# Patient Record
Sex: Female | Born: 1990 | Race: Black or African American | Hispanic: No | Marital: Single | Smoking: Never smoker
Health system: Southern US, Community
[De-identification: ages and names within clinical notes are randomized; demographics above are authoritative.]

## PROBLEM LIST (undated history)

## (undated) DIAGNOSIS — Z9889 Other specified postprocedural states: Secondary | ICD-10-CM

## (undated) DIAGNOSIS — J302 Other seasonal allergic rhinitis: Secondary | ICD-10-CM

## (undated) DIAGNOSIS — K429 Umbilical hernia without obstruction or gangrene: Secondary | ICD-10-CM

## (undated) DIAGNOSIS — J45909 Unspecified asthma, uncomplicated: Secondary | ICD-10-CM

## (undated) DIAGNOSIS — R112 Nausea with vomiting, unspecified: Secondary | ICD-10-CM

## (undated) HISTORY — PX: ANTERIOR CRUCIATE LIGAMENT REPAIR: SHX115

---
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
## 2013-07-04 ENCOUNTER — Emergency Department (INDEPENDENT_AMBULATORY_CARE_PROVIDER_SITE_OTHER)
Admission: EM | Admit: 2013-07-04 | Discharge: 2013-07-04 | Disposition: A | Discharge status: Home / Self Care | Payer: BC Managed Care – PPO | Source: Home / Self Care | Attending: Emergency Medicine | Admitting: Emergency Medicine

## 2013-07-04 ENCOUNTER — Encounter (HOSPITAL_COMMUNITY): Payer: Self-pay | Admitting: Emergency Medicine

## 2013-07-04 DIAGNOSIS — K2970 Gastritis, unspecified, without bleeding: Secondary | ICD-10-CM

## 2013-07-04 MED ORDER — ONDANSETRON 4 MG PO TBDP
ORAL_TABLET | ORAL | Status: AC
  Filled 2013-07-04: qty 1

## 2013-07-04 MED ORDER — ONDANSETRON HCL 4 MG PO TABS: 4.0000 mg | ORAL_TABLET | Freq: Four times a day (QID) | ORAL | Status: DC

## 2013-07-04 MED ORDER — ONDANSETRON 4 MG PO TBDP
4.0000 mg | ORAL_TABLET | Freq: Once | ORAL | Status: AC
  Administered 2013-07-04: 4 mg via ORAL

## 2013-07-04 NOTE — ED Provider Notes (Signed)
Medical screening examination/treatment/procedure(s) were performed by non-physician practitioner and as supervising physician I was immediately available for consultation/collaboration.  Ednamae Schiano, M.D.  Emilliano Dilworth C Celese Banner, MD 07/04/13 1438 

## 2013-07-04 NOTE — ED Provider Notes (Signed)
CSN: [id number]     Arrival date & time 07/04/13  4098 History   None    Chief Complaint  Patient presents with  . Emesis    onset 6:00 a.m today.    (Consider location/radiation/quality/duration/timing/severity/associated sxs/prior Treatment) HPI Comments: Pt drank heavily last night. Doesn't remember how much she drank. Woke up this morning, tried to drink water and vomited.  Unable to keep liquids down.   Patient is a 22 y.o. female presenting with vomiting. The history is provided by the patient.  Emesis Severity:  Moderate Duration:  5 hours Timing:  Intermittent Number of daily episodes:  5 Quality:  Stomach contents Progression:  Unchanged Chronicity:  New Recent urination:  Decreased Relieved by:  None tried Worsened by:  Liquids Ineffective treatments:  Liquids Associated symptoms: diarrhea   Associated symptoms: no abdominal pain, no chills and no fever   Diarrhea:    Quality:  Semi-solid   Number of occurrences:  2   Severity:  Mild   Duration:  4 hours   Timing:  Intermittent   Progression:  Unchanged Risk factors: alcohol use   Risk factors: no sick contacts     History reviewed. No pertinent past medical history. History reviewed. No pertinent past surgical history. History reviewed. No pertinent family history. History  Substance Use Topics  . Smoking status: Never Smoker   . Smokeless tobacco: Not on file  . Alcohol Use: Yes   OB History   Grav Para Term Preterm Abortions TAB SAB Ect Mult Living                 Review of Systems  Constitutional: Negative for fever and chills.  Gastrointestinal: Positive for nausea, vomiting and diarrhea. Negative for abdominal pain.    Allergies  Review of patient's allergies indicates no known allergies.  Home Medications   Current Outpatient Rx  Name  Route  Sig  Dispense  Refill  . ondansetron (ZOFRAN) 4 MG tablet   Oral   Take 1 tablet (4 mg total) by mouth every 6 (six) hours.   12 tablet   0     BP 126/85  Pulse 73  Temp(Src) 97.8 F (36.6 C)  Resp 20  SpO2 99%  LMP 06/25/2013 Physical Exam  Constitutional: She appears well-developed and well-nourished.  Appears ill  Cardiovascular: Normal rate and regular rhythm.   Pulmonary/Chest: Effort normal and breath sounds normal.  Abdominal: Soft. She exhibits no distension. Bowel sounds are decreased. There is generalized tenderness. There is no rigidity, no rebound and no guarding.  abd tenderness to palp is mild    ED Course  Procedures (including critical care time) Labs Review Labs Reviewed - No data to display Imaging Review No results found.  EKG Interpretation     Ventricular Rate:    PR Interval:    QRS Duration:   QT Interval:    QTC Calculation:   R Axis:     Text Interpretation:              MDM   1. Gastritis   Pt feels much better after zofran odt 4mg  and drinking a small can of ginger ale. Rx zofran 4mg  q6 hours prn nausea #12. Clear liquids today, can gradually progress to bland solids when stomach is more settled. Discussed dangers of drinking heavily.      Cathlyn Parsons, NP 07/04/13 1104

## 2013-07-04 NOTE — ED Notes (Signed)
Reports vomiting and diarrhea onset 6:00 a.m today. Mother states that pt is vomit is yellow and foamy. Unable to keep anything down.  Denies changes in diet. States had a lot to drink the night before. Denies fever.

## 2013-07-04 NOTE — ED Notes (Signed)
Fluid challenge at 10:21 a.m mw,cma

## 2014-10-11 ENCOUNTER — Other Ambulatory Visit: Payer: Self-pay | Admitting: Family Medicine

## 2014-10-11 DIAGNOSIS — R519 Headache, unspecified: Secondary | ICD-10-CM

## 2014-10-11 DIAGNOSIS — R51 Headache: Principal | ICD-10-CM

## 2016-03-13 ENCOUNTER — Other Ambulatory Visit: Payer: Self-pay | Admitting: Family Medicine

## 2016-03-13 DIAGNOSIS — K429 Umbilical hernia without obstruction or gangrene: Secondary | ICD-10-CM

## 2016-10-17 ENCOUNTER — Other Ambulatory Visit: Payer: Self-pay | Admitting: Surgery

## 2016-11-22 ENCOUNTER — Emergency Department
Admission: EM | Admit: 2016-11-22 | Discharge: 2016-11-22 | Disposition: A | Discharge status: Home / Self Care | Payer: BLUE CROSS/BLUE SHIELD | Attending: Emergency Medicine | Admitting: Emergency Medicine

## 2016-11-22 ENCOUNTER — Encounter: Payer: Self-pay | Admitting: Emergency Medicine

## 2016-11-22 DIAGNOSIS — K625 Hemorrhage of anus and rectum: Secondary | ICD-10-CM

## 2016-11-22 DIAGNOSIS — K649 Unspecified hemorrhoids: Secondary | ICD-10-CM | POA: Insufficient documentation

## 2016-11-22 LAB — COMPREHENSIVE METABOLIC PANEL
ALBUMIN: 4 g/dL (ref 3.5–5.0)
ALT: 19 U/L (ref 14–54)
AST: 27 U/L (ref 15–41)
Alkaline Phosphatase: 54 U/L (ref 38–126)
Anion gap: 6 (ref 5–15)
BILIRUBIN TOTAL: 0.3 mg/dL (ref 0.3–1.2)
BUN: 11 mg/dL (ref 6–20)
CO2: 25 mmol/L (ref 22–32)
Calcium: 8.7 mg/dL — ABNORMAL LOW (ref 8.9–10.3)
Chloride: 106 mmol/L (ref 101–111)
Creatinine, Ser: 0.75 mg/dL (ref 0.44–1.00)
GFR calc Af Amer: >60 mL/min (ref >60)
GFR calc non Af Amer: >60 mL/min (ref >60)
GLUCOSE: 89 mg/dL (ref 65–99)
POTASSIUM: 3.5 mmol/L (ref 3.5–5.1)
Sodium: 137 mmol/L (ref 135–145)
TOTAL PROTEIN: 7.6 g/dL (ref 6.5–8.1)

## 2016-11-22 LAB — CBC
HEMATOCRIT: 38.1 % (ref 35.0–47.0)
Hemoglobin: 12.8 g/dL (ref 12.0–16.0)
MCH: 29.6 pg (ref 26.0–34.0)
MCHC: 33.8 g/dL (ref 32.0–36.0)
MCV: 87.6 fL (ref 80.0–100.0)
Platelets: 197 10*3/uL (ref 150–440)
RBC: 4.34 MIL/uL (ref 3.80–5.20)
RDW: 13.8 % (ref 11.5–14.5)
WBC: 5.1 10*3/uL (ref 3.6–11.0)

## 2016-11-22 LAB — POCT PREGNANCY, URINE: Preg Test, Ur: NEGATIVE

## 2016-11-22 MED ORDER — HYDROCORTISONE 2.5 % RE CREA: 1.0000 "application " | TOPICAL_CREAM | Freq: Two times a day (BID) | RECTAL | 0 refills | Status: DC

## 2016-11-22 MED ORDER — DOCUSATE SODIUM 100 MG PO CAPS: 100.0000 mg | ORAL_CAPSULE | Freq: Every day | ORAL | 2 refills | Status: DC | PRN

## 2016-11-22 NOTE — ED Notes (Signed)
Pt c/o bright red rectal bleeding that started 2 days ago. Pt states that the blood was in the toilet when she had a bowel movement. Pt states that she has a hx/o hemorrhoids. Pt states that she has not been using the bathroom on a regular basis, when she does stool it is hard stool.

## 2016-11-22 NOTE — ED Triage Notes (Signed)
States passing bright red blood and stool x 2 days.

## 2016-11-22 NOTE — ED Provider Notes (Signed)
[facility] Emergency Department Provider Note        Time seen: ----------------------------------------- 2:55 PM on 11/22/2016 -----------------------------------------    I have reviewed the triage vital signs and the nursing notes.   HISTORY  Chief Complaint Rectal Bleeding    HPI Sydney Wallace is a 26 y.o. female who presents to ER for right red blood per rectum that started 2 days ago. Patient states the blood was in the toilet when she had a bowel movement. Patient states she's had hemorrhoids in the distant past. Patient states recently she has been constipated and that she saw the bleeding after she went to the bathroom recently. She denies fevers, chills or other complaints.   History reviewed. No pertinent past medical history.  There are no active problems to display for this patient.   History reviewed. No pertinent surgical history.  Allergies Patient has no known allergies.  Social History Social History  Substance Use Topics  . Smoking status: Never Smoker  . Smokeless tobacco: Not on file  . Alcohol use Yes    Review of Systems Constitutional: Negative for fever. Cardiovascular: Negative for chest pain. Respiratory: Negative for shortness of breath. Gastrointestinal: Negative for abdominal pain, vomiting and diarrhea.Positive for rectal bleeding Skin: Negative for rash. Neurological: Negative for headaches, focal weakness or numbness.  10-point ROS otherwise negative.  ____________________________________________   PHYSICAL EXAM:  VITAL SIGNS: ED Triage Vitals  Enc Vitals Group     BP 11/22/16 1126 112/69     Pulse Rate 11/22/16 1126 71     Resp 11/22/16 1126 18     Temp 11/22/16 1126 98.1 F (36.7 C)     Temp Source 11/22/16 1126 Oral     SpO2 11/22/16 1126 100 %     Weight 11/22/16 1129 150 lb (68 kg)     Height 11/22/16 1129 5\' 4"  (1.626 m)     Head Circumference --      Peak Flow --      Pain Score  11/22/16 1129 8     Pain Loc --      Pain Edu? --      Excl. in GC? --    Constitutional: Alert and oriented. Well appearing and in no distress. Eyes: Conjunctivae are normal. PERRL. Normal extraocular movements. Cardiovascular: Normal rate, regular rhythm. No murmurs, rubs, or gallops. Respiratory: Normal respiratory effort without tachypnea nor retractions. Breath sounds are clear and equal bilaterally. No wheezes/rales/rhonchi. Gastrointestinal: Soft and nontender. Normal bowel sounds Rectal: Heme-negative, no blood, small external hemorrhoid that is nonbleeding Musculoskeletal: Nontender with normal range of motion in all extremities. No lower extremity tenderness nor edema. Neurologic:  Normal speech and language. No gross focal neurologic deficits are appreciated.  Skin:  Skin is warm, dry and intact. No rash noted. Psychiatric: Mood and affect are normal. Speech and behavior are normal.  ___________________________________________  ED COURSE:  Pertinent labs & imaging results that were available during my care of the patient were reviewed by me and considered in my medical decision making (see chart for details). Presents to ER with likely hemorrhoids from constipation. We will assess with labs and likely discharged with stool softeners and topical steroid treatments.   Procedures ____________________________________________   LABS (pertinent positives/negatives)  Labs Reviewed  COMPREHENSIVE METABOLIC PANEL - Abnormal; Notable for the following:       Result Value   Calcium 8.7 (*)    All other components within normal limits  CBC  POC OCCULT BLOOD, ED  POCT PREGNANCY, URINE   ____________________________________________  FINAL ASSESSMENT AND PLAN  Hemorrhoids, constipation  Plan: Patient with labs as dictated above. Patient presents the ER no distress. Labs are reassuring. She'll be discharged with Anusol cream as well as Colace. She is encouraged to take MiraLAX  if she feels she is significant constipated.   Emily FilbertWilliams, Jonathan E, MD   Note: This note was generated in part or whole with voice recognition software. Voice recognition is usually quite accurate but there are transcription errors that can and very often do occur. I apologize for any typographical errors that were not detected and corrected.     Emily FilbertJonathan E Williams, MD 11/22/16 [phone]

## 2016-12-07 DIAGNOSIS — K429 Umbilical hernia without obstruction or gangrene: Secondary | ICD-10-CM

## 2016-12-07 HISTORY — DX: Umbilical hernia without obstruction or gangrene: K42.9

## 2017-01-02 ENCOUNTER — Encounter (HOSPITAL_BASED_OUTPATIENT_CLINIC_OR_DEPARTMENT_OTHER): Payer: Self-pay | Admitting: *Deleted

## 2017-01-07 NOTE — H&P (Signed)
Sydney Wallace  Location: [facility] Patient #: [id number] DOB: [birth year] Single / Language: [place] / Race: Black or African American Female   History of Present Illness  The patient is a 26 year old female who presents with an umbilical hernia. This is a pleasant female referred by Dr. Orvan July for an umbilical hernia. The patient reports noticing approximately 2 months ago. She is very active and does a lot of heavy lifting. She reports sharp pain of the umbilicus which is intermittent and moderate in intensity. She has had no nausea or vomiting or any other obstructive symptoms. She is otherwise healthy without complaints.   Past Surgical History  Knee Surgery  Left.  Diagnostic Studies History Mammogram  never Pap Smear  1-5 years ago  Allergies  No Known Drug Allergies  Allergies Reconciled   Medication History  No Current Medications Medications Reconciled  Social History Alcohol use  Occasional alcohol use. No caffeine use  No drug use  Tobacco use  Former smoker.  Family History Thyroid problems  Mother.  Pregnancy / Birth History    Age at menarche  14 years. Contraceptive History  Depo-provera. Gravida  0 Para  0 Regular periods   Other Problems  Asthma  Umbilical Hernia Repair     Review of Systems    HEENT Not Present- Earache, Hearing Loss, Hoarseness, Nose Bleed, Oral Ulcers, Ringing in the Ears, Seasonal Allergies, Sinus Pain, Sore Throat, Visual Disturbances, Wears glasses/contact lenses and Yellow Eyes. Respiratory Not Present- Bloody sputum, Chronic Cough, Difficulty Breathing, Snoring and Wheezing. Cardiovascular Not Present- Chest Pain, Difficulty Breathing Lying Down, Leg Cramps, Palpitations, Rapid Heart Rate, Shortness of Breath and Swelling of Extremities. Gastrointestinal Present- Abdominal Pain. Not Present- Bloating, Bloody Stool, Change in Bowel Habits, Chronic diarrhea, Constipation,  Difficulty Swallowing, Excessive gas, Gets full quickly at meals, Hemorrhoids, Indigestion, Nausea, Rectal Pain and Vomiting. Female Genitourinary Not Present- Frequency, Nocturia, Painful Urination, Pelvic Pain and Urgency. Psychiatric Not Present- Anxiety, Bipolar, Change in Sleep Pattern, Depression, Fearful and Frequent crying. Endocrine Not Present- Cold Intolerance, Excessive Hunger, Hair Changes, Heat Intolerance, Hot flashes and New Diabetes. Hematology Not Present- Blood Thinners, Easy Bruising, Excessive bleeding, Gland problems, HIV and Persistent Infections.  Vitals  Weight: 148.6 lb Height: 64in Body Surface Area: 1.72 m Body Mass Index: 25.51 kg/m  Temp.: 98.14F  Pulse: 76 (Regular)  BP: 112/78 (Sitting, Left Arm, Standard)   Physical Exam  General Mental Status-Alert. General Appearance-Consistent with stated age. Hydration-Well hydrated. Voice-Normal.  Head and Neck Head-normocephalic, atraumatic with no lesions or palpable masses. Trachea-midline.  Eye Eyeball - Bilateral-Extraocular movements intact. Sclera/Conjunctiva - Bilateral-No scleral icterus.  Chest and Lung Exam Chest and lung exam reveals -quiet, even and easy respiratory effort with no use of accessory muscles and on auscultation, normal breath sounds, no adventitious sounds and normal vocal resonance. Inspection Chest Wall - Normal. Back - normal.  Cardiovascular Cardiovascular examination reveals -normal heart sounds, regular rate and rhythm with no murmurs and normal pedal pulses bilaterally.  Abdomen Inspection Skin - Scar - no surgical scars. Hernias - Umbilical hernia - Incarcerated. Note: There is a very small, incarcerated umbilical hernia containing omentum which is mildly tender just above the umbilicus. Palpation/Percussion Palpation and Percussion of the abdomen reveal - Soft, Non Tender, No Rebound tenderness, No Rigidity (guarding) and No  hepatosplenomegaly. Auscultation Auscultation of the abdomen reveals - Bowel sounds normal.  Neurologic - Did not examine.  Musculoskeletal - Did not examine.    Assessment &  Plan   UMBILICAL HERNIA (K42.9)  Impression: I discussed the diagnosis with the patient. I discussed hernia repair. As she is symptomatic and does a lot of lifting, umbilical hernia repair with possible mesh is recommended. I discussed the surgical procedure with her in detail. I discussed the risk of surgery which includes but is not limited to bleeding, infection, injury to surrounding structures, hernia recurrence, postoperative recovery, etc. She understands and wishes to proceed with surgery which will be scheduled

## 2017-01-08 ENCOUNTER — Ambulatory Visit (HOSPITAL_BASED_OUTPATIENT_CLINIC_OR_DEPARTMENT_OTHER): Payer: BLUE CROSS/BLUE SHIELD | Admitting: Certified Registered"

## 2017-01-08 ENCOUNTER — Encounter (HOSPITAL_BASED_OUTPATIENT_CLINIC_OR_DEPARTMENT_OTHER)
Admission: RE | Disposition: A | Discharge status: Home / Self Care | Payer: Self-pay | Source: Ambulatory Visit | Attending: Surgery

## 2017-01-08 ENCOUNTER — Encounter (HOSPITAL_BASED_OUTPATIENT_CLINIC_OR_DEPARTMENT_OTHER): Payer: Self-pay | Admitting: Certified Registered"

## 2017-01-08 ENCOUNTER — Ambulatory Visit (HOSPITAL_BASED_OUTPATIENT_CLINIC_OR_DEPARTMENT_OTHER)
Admission: RE | Admit: 2017-01-08 | Discharge: 2017-01-08 | Disposition: A | Discharge status: Home / Self Care | Payer: BLUE CROSS/BLUE SHIELD | Source: Ambulatory Visit | Attending: Surgery | Admitting: Surgery

## 2017-01-08 DIAGNOSIS — Z8349 Family history of other endocrine, nutritional and metabolic diseases: Secondary | ICD-10-CM | POA: Insufficient documentation

## 2017-01-08 DIAGNOSIS — Z87891 Personal history of nicotine dependence: Secondary | ICD-10-CM | POA: Diagnosis not present

## 2017-01-08 DIAGNOSIS — K429 Umbilical hernia without obstruction or gangrene: Secondary | ICD-10-CM | POA: Insufficient documentation

## 2017-01-08 HISTORY — DX: Umbilical hernia without obstruction or gangrene: K42.9

## 2017-01-08 HISTORY — DX: Other seasonal allergic rhinitis: J30.2

## 2017-01-08 HISTORY — PX: INSERTION OF MESH: SHX5868

## 2017-01-08 HISTORY — DX: Other specified postprocedural states: Z98.890

## 2017-01-08 HISTORY — DX: Nausea with vomiting, unspecified: R11.2

## 2017-01-08 HISTORY — PX: UMBILICAL HERNIA REPAIR: SHX196

## 2017-01-08 SURGERY — REPAIR, HERNIA, UMBILICAL, ADULT
Anesthesia: General

## 2017-01-08 MED ORDER — MEPERIDINE HCL 25 MG/ML IJ SOLN: 6.2500 mg | INTRAMUSCULAR | Status: DC | PRN

## 2017-01-08 MED ORDER — CEFAZOLIN SODIUM-DEXTROSE 2-4 GM/100ML-% IV SOLN
2.0000 g | INTRAVENOUS | Status: AC
  Administered 2017-01-08: 2 g via INTRAVENOUS

## 2017-01-08 MED ORDER — LACTATED RINGERS IV SOLN
INTRAVENOUS | Status: DC
  Administered 2017-01-08 (×2): via INTRAVENOUS

## 2017-01-08 MED ORDER — METOCLOPRAMIDE HCL 5 MG/ML IJ SOLN
10.0000 mg | Freq: Once | INTRAMUSCULAR | Status: AC | PRN
  Administered 2017-01-08: 10 mg via INTRAVENOUS

## 2017-01-08 MED ORDER — FENTANYL CITRATE (PF) 100 MCG/2ML IJ SOLN
50.0000 ug | INTRAMUSCULAR | Status: AC | PRN
  Administered 2017-01-08: 25 ug via INTRAVENOUS
  Administered 2017-01-08 (×2): 50 ug via INTRAVENOUS

## 2017-01-08 MED ORDER — ONDANSETRON HCL 4 MG/2ML IJ SOLN
INTRAMUSCULAR | Status: DC | PRN
  Administered 2017-01-08: 4 mg via INTRAVENOUS

## 2017-01-08 MED ORDER — FENTANYL CITRATE (PF) 100 MCG/2ML IJ SOLN
INTRAMUSCULAR | Status: AC
  Filled 2017-01-08: qty 2

## 2017-01-08 MED ORDER — CHLORHEXIDINE GLUCONATE CLOTH 2 % EX PADS: 6.0000 | MEDICATED_PAD | Freq: Once | CUTANEOUS | Status: DC

## 2017-01-08 MED ORDER — MIDAZOLAM HCL 2 MG/2ML IJ SOLN
INTRAMUSCULAR | Status: AC
  Filled 2017-01-08: qty 2

## 2017-01-08 MED ORDER — SCOPOLAMINE 1 MG/3DAYS TD PT72: 1.0000 | MEDICATED_PATCH | Freq: Once | TRANSDERMAL | Status: DC | PRN

## 2017-01-08 MED ORDER — BUPIVACAINE-EPINEPHRINE (PF) 0.5% -1:200000 IJ SOLN
INTRAMUSCULAR | Status: AC
  Filled 2017-01-08: qty 30

## 2017-01-08 MED ORDER — LIDOCAINE HCL (CARDIAC) 20 MG/ML IV SOLN
INTRAVENOUS | Status: DC | PRN
  Administered 2017-01-08: 60 mg via INTRAVENOUS

## 2017-01-08 MED ORDER — BUPIVACAINE-EPINEPHRINE 0.5% -1:200000 IJ SOLN
INTRAMUSCULAR | Status: DC | PRN
  Administered 2017-01-08: 20 mL

## 2017-01-08 MED ORDER — CEFAZOLIN SODIUM-DEXTROSE 2-4 GM/100ML-% IV SOLN
INTRAVENOUS | Status: AC
  Filled 2017-01-08: qty 100

## 2017-01-08 MED ORDER — PROPOFOL 10 MG/ML IV BOLUS
INTRAVENOUS | Status: DC | PRN
  Administered 2017-01-08: 200 mg via INTRAVENOUS

## 2017-01-08 MED ORDER — DEXAMETHASONE SODIUM PHOSPHATE 4 MG/ML IJ SOLN
INTRAMUSCULAR | Status: DC | PRN
  Administered 2017-01-08: 10 mg via INTRAVENOUS

## 2017-01-08 MED ORDER — FENTANYL CITRATE (PF) 100 MCG/2ML IJ SOLN
25.0000 ug | INTRAMUSCULAR | Status: DC | PRN
  Administered 2017-01-08: 50 ug via INTRAVENOUS

## 2017-01-08 MED ORDER — OXYCODONE-ACETAMINOPHEN 5-325 MG PO TABS: 1.0000 | ORAL_TABLET | ORAL | 0 refills | Status: DC | PRN

## 2017-01-08 MED ORDER — LACTATED RINGERS IV SOLN: INTRAVENOUS | Status: DC

## 2017-01-08 MED ORDER — METOCLOPRAMIDE HCL 5 MG/ML IJ SOLN
INTRAMUSCULAR | Status: AC
  Filled 2017-01-08: qty 2

## 2017-01-08 MED ORDER — SODIUM BICARBONATE 4 % IV SOLN
INTRAVENOUS | Status: AC
  Filled 2017-01-08: qty 5

## 2017-01-08 MED ORDER — LIDOCAINE HCL 1 % IJ SOLN
INTRAMUSCULAR | Status: AC
  Filled 2017-01-08: qty 20

## 2017-01-08 MED ORDER — SCOPOLAMINE 1 MG/3DAYS TD PT72
MEDICATED_PATCH | TRANSDERMAL | Status: AC
  Filled 2017-01-08: qty 2

## 2017-01-08 MED ORDER — MIDAZOLAM HCL 2 MG/2ML IJ SOLN
1.0000 mg | INTRAMUSCULAR | Status: DC | PRN
  Administered 2017-01-08: 2 mg via INTRAVENOUS

## 2017-01-08 SURGICAL SUPPLY — 44 items
BLADE CLIPPER SURG (BLADE) IMPLANT
BLADE HEX COATED 2.75 (ELECTRODE) ×3 IMPLANT
BLADE SURG 15 STRL LF DISP TIS (BLADE) ×1 IMPLANT
BLADE SURG 15 STRL SS (BLADE) ×2
CANISTER SUCT 1200ML W/VALVE (MISCELLANEOUS) IMPLANT
CHLORAPREP W/TINT 26ML (MISCELLANEOUS) ×3 IMPLANT
COVER BACK TABLE 60X90IN (DRAPES) ×3 IMPLANT
COVER MAYO STAND STRL (DRAPES) ×3 IMPLANT
DECANTER SPIKE VIAL GLASS SM (MISCELLANEOUS) IMPLANT
DERMABOND ADVANCED (GAUZE/BANDAGES/DRESSINGS) ×2
DERMABOND ADVANCED .7 DNX12 (GAUZE/BANDAGES/DRESSINGS) ×1 IMPLANT
DRAPE LAPAROTOMY 100X72 PEDS (DRAPES) ×3 IMPLANT
DRAPE UTILITY XL STRL (DRAPES) ×3 IMPLANT
DRSG TEGADERM 2-3/8X2-3/4 SM (GAUZE/BANDAGES/DRESSINGS) IMPLANT
ELECT REM PT RETURN 9FT ADLT (ELECTROSURGICAL) ×3
ELECTRODE REM PT RTRN 9FT ADLT (ELECTROSURGICAL) ×1 IMPLANT
GLOVE BIOGEL PI IND STRL 7.0 (GLOVE) ×2 IMPLANT
GLOVE BIOGEL PI INDICATOR 7.0 (GLOVE) ×4
GLOVE ECLIPSE 6.5 STRL STRAW (GLOVE) ×3 IMPLANT
GLOVE SURG SIGNA 7.5 PF LTX (GLOVE) ×3 IMPLANT
GOWN STRL REUS W/ TWL LRG LVL3 (GOWN DISPOSABLE) ×1 IMPLANT
GOWN STRL REUS W/ TWL XL LVL3 (GOWN DISPOSABLE) ×1 IMPLANT
GOWN STRL REUS W/TWL LRG LVL3 (GOWN DISPOSABLE) ×2
GOWN STRL REUS W/TWL XL LVL3 (GOWN DISPOSABLE) ×2
MESH VENTRALEX ST 1-7/10 CRC S (Mesh General) ×3 IMPLANT
NEEDLE HYPO 25X1 1.5 SAFETY (NEEDLE) ×3 IMPLANT
NS IRRIG 1000ML POUR BTL (IV SOLUTION) IMPLANT
PACK BASIN DAY SURGERY FS (CUSTOM PROCEDURE TRAY) ×3 IMPLANT
PENCIL BUTTON HOLSTER BLD 10FT (ELECTRODE) ×3 IMPLANT
SLEEVE SCD COMPRESS KNEE MED (MISCELLANEOUS) ×3 IMPLANT
SPONGE LAP 4X18 X RAY DECT (DISPOSABLE) IMPLANT
SUT MNCRL AB 4-0 PS2 18 (SUTURE) ×3 IMPLANT
SUT NOVA 0 T19/GS 22DT (SUTURE) IMPLANT
SUT NOVA NAB DX-16 0-1 5-0 T12 (SUTURE) ×3 IMPLANT
SUT VIC AB 2-0 SH 27 (SUTURE)
SUT VIC AB 2-0 SH 27XBRD (SUTURE) IMPLANT
SUT VIC AB 3-0 SH 27 (SUTURE) ×2
SUT VIC AB 3-0 SH 27X BRD (SUTURE) ×1 IMPLANT
SYR CONTROL 10ML LL (SYRINGE) ×3 IMPLANT
TOWEL OR 17X24 6PK STRL BLUE (TOWEL DISPOSABLE) ×3 IMPLANT
TOWEL OR NON WOVEN STRL DISP B (DISPOSABLE) ×3 IMPLANT
TUBE CONNECTING 20'X1/4 (TUBING)
TUBE CONNECTING 20X1/4 (TUBING) IMPLANT
YANKAUER SUCT BULB TIP NO VENT (SUCTIONS) IMPLANT

## 2017-01-08 NOTE — Interval H&P Note (Signed)
History and Physical Interval Note: no change in H and P  01/08/2017 9:50 AM  Sydney Wallace  has presented today for surgery, with the diagnosis of umbilical hernia  The various methods of treatment have been discussed with the patient and family. After consideration of risks, benefits and other options for treatment, the patient has consented to  Procedure(s): UMBILLICAL HERNIA REPAIR WITH POSSIBLE MESH (N/A) INSERTION OF MESH (N/A) as a surgical intervention .  The patient's history has been reviewed, patient examined, no change in status, stable for surgery.  I have reviewed the patient's chart and labs.  Questions were answered to the patient's satisfaction.     Antonietta Lansdowne A

## 2017-01-08 NOTE — Discharge Instructions (Signed)
[facility] _______[facility]  UMBILICAL OR INGUINAL HERNIA REPAIR: POST OP INSTRUCTIONS  Always review your discharge instruction sheet given to you by the facility where your surgery was performed. IF YOU HAVE DISABILITY OR FAMILY LEAVE FORMS, YOU MUST BRING THEM TO THE OFFICE FOR PROCESSING.   DO NOT GIVE THEM TO YOUR DOCTOR.  1. A  prescription for pain medication may be given to you upon discharge.  Take your pain medication as prescribed, if needed.  If narcotic pain medicine is not needed, then you may take acetaminophen (Tylenol) or ibuprofen (Advil) as needed. 2. Take your usually prescribed medications unless otherwise directed. If you need a refill on your pain medication, please contact your pharmacy.  They will contact our office to request authorization. Prescriptions will not be filled after 5 pm or on week-ends. 3. You should follow a light diet the first 24 hours after arrival home, such as soup and crackers, etc.  Be sure to include lots of fluids daily.  Resume your normal diet the day after surgery. 4.Most patients will experience some swelling and bruising around the umbilicus or in the groin and scrotum.  Ice packs and reclining will help.  Swelling and bruising can take several days to resolve.  6. It is common to experience some constipation if taking pain medication after surgery.  Increasing fluid intake and taking a stool softener (such as Colace) will usually help or prevent this problem from occurring.  A mild laxative (Milk of Magnesia or Miralax) should be taken according to package directions if there are no bowel movements after 48 hours. 7. Unless discharge instructions indicate otherwise, you may remove your bandages 24-48 hours after surgery, and you may shower at that time.  You may have steri-strips (small skin tapes) in place directly over the incision.  These strips should be left on the skin for 7-10 days.  If your surgeon used skin glue on the  incision, you may shower in 24 hours.  The glue will flake off over the next 2-3 weeks.  Any sutures or staples will be removed at the office during your follow-up visit. 8. ACTIVITIES:  You may resume regular (light) daily activities beginning the next day--such as daily self-care, walking, climbing stairs--gradually increasing activities as tolerated.  You may have sexual intercourse when it is comfortable.  Refrain from any heavy lifting or straining until approved by your doctor.  a.You may drive when you are no longer taking prescription pain medication, you can comfortably wear a seatbelt, and you can safely maneuver your car and apply brakes. b.RETURN TO WORK:   _____________________________________________  9.You should see your doctor in the office for a follow-up appointment approximately 2-3 weeks after your surgery.  Make sure that you call for this appointment within a day or two after you arrive home to insure a convenient appointment time. 10.OTHER INSTRUCTIONS: _OK TO SHOWER TOMORROW ICE PACK ALSO FOR PAIN. NO LIFTING MORE THAN 15 POUNDS FOR 4 WEEKS________________________    _____________________________________  WHEN TO CALL YOUR DOCTOR: 1. Fever over 101.0 2. Inability to urinate 3. Nausea and/or vomiting 4. Extreme swelling or bruising 5. Continued bleeding from incision. 6. Increased pain, redness, or drainage from the incision  The clinic staff is available to answer your questions during regular business hours.  Please dont hesitate to call and ask to speak to one of the nurses for clinical concerns.  If you have a medical emergency, go to the nearest emergency room or call  911.  [facility] is always on call at the hospital   [address], [address], [place], [place]  [address] ?  [address] [address], [place], [place]   [address] [phone] ? [phone] ? [phone] [phone]   Post Anesthesia Home Care Instructions  Activity: Get  plenty of rest for the remainder of the day. A responsible individual must stay with you for 24 hours following the procedure.  For the next 24 hours, DO NOT: -Drive a car -Advertising copywriterperate machinery -Drink alcoholic beverages -Take any medication unless instructed by your physician -Make any legal decisions or sign important papers.  Meals: Start with liquid foods such as gelatin or soup. Progress to regular foods as tolerated. Avoid greasy, spicy, heavy foods. If nausea and/or vomiting occur, drink only clear liquids until the nausea and/or vomiting subsides. Call your physician if vomiting continues.  Special Instructions/Symptoms: Your throat may feel dry or sore from the anesthesia or the breathing tube placed in your throat during surgery. If this causes discomfort, gargle with warm salt water. The discomfort should disappear within 24 hours.  If you had a scopolamine patch placed behind your ear for the management of post- operative nausea and/or vomiting:  1. The medication in the patch is effective for 72 hours, after which it should be removed.  Wrap patch in a tissue and discard in the trash. Wash hands thoroughly with soap and water. 2. You may remove the patch earlier than 72 hours if you experience unpleasant side effects which may include dry mouth, dizziness or visual disturbances. 3. Avoid touching the patch. Wash your hands with soap and water after contact with the patch.     Web site: [email or web address]

## 2017-01-08 NOTE — Transfer of Care (Signed)
Immediate Anesthesia Transfer of Care Note  Patient: Sydney Wallace  Procedure(s) Performed: Procedure(s): UMBILICAL HERNIA REPAIR (N/A) INSERTION OF MESH (N/A)  Patient Location: PACU  Anesthesia Type:General  Level of Consciousness: awake and patient cooperative  Airway & Oxygen Therapy: Patient Spontanous Breathing and Patient connected to face mask oxygen  Post-op Assessment: Report given to RN and Post -op Vital signs reviewed and stable  Post vital signs: Reviewed and stable  Last Vitals:  Vitals:   01/08/17 0832 01/08/17 1045  BP: (!) 128/59 (!) 101/52  Pulse: 69 (!) 59  Resp: 16 10  Temp: 37.1 C     Last Pain:  Vitals:   01/08/17 0832  TempSrc: Oral  PainSc: 0-No pain      Patients Stated Pain Goal: 3 (01/08/17 [id number])  Complications: No apparent anesthesia complications

## 2017-01-08 NOTE — Anesthesia Postprocedure Evaluation (Signed)
Anesthesia Post Note  Patient: Alda Leaayriah Leinberger  Procedure(s) Performed: Procedure(s) (LRB): UMBILICAL HERNIA REPAIR (N/A) INSERTION OF MESH (N/A)  Patient location during evaluation: PACU Anesthesia Type: General Level of consciousness: awake and alert Pain management: pain level controlled Vital Signs Assessment: post-procedure vital signs reviewed and stable Respiratory status: spontaneous breathing, nonlabored ventilation, respiratory function stable and patient connected to nasal cannula oxygen Cardiovascular status: blood pressure returned to baseline and stable Postop Assessment: no signs of nausea or vomiting Anesthetic complications: no       Last Vitals:  Vitals:   01/08/17 1200 01/08/17 1231  BP: 111/73 (!) 104/57  Pulse: 61 71  Resp: 13 16  Temp:  36.9 C    Last Pain:  Vitals:   01/08/17 1231  TempSrc: Oral  PainSc:                  Phillips Groutarignan, Yahel Fuston

## 2017-01-08 NOTE — Anesthesia Preprocedure Evaluation (Signed)
Anesthesia Evaluation  Patient identified by MRN, date of birth, ID band Patient awake    Reviewed: Allergy & Precautions, NPO status , Patient's Chart, lab work & pertinent test results  History of Anesthesia Complications (+) PONV  Airway Mallampati: I  TM Distance: >3 FB Neck ROM: Full    Dental no notable dental hx.    Pulmonary neg pulmonary ROS,    Pulmonary exam normal breath sounds clear to auscultation       Cardiovascular negative cardio ROS Normal cardiovascular exam Rhythm:Regular Rate:Normal     Neuro/Psych negative neurological ROS  negative psych ROS   GI/Hepatic negative GI ROS, Neg liver ROS,   Endo/Other  negative endocrine ROS  Renal/GU negative Renal ROS  negative genitourinary   Musculoskeletal negative musculoskeletal ROS (+)   Abdominal   Peds negative pediatric ROS (+)  Hematology negative hematology ROS (+)   Anesthesia Other Findings   Reproductive/Obstetrics negative OB ROS                             Anesthesia Physical Anesthesia Plan  ASA: I  Anesthesia Plan: General   Post-op Pain Management:    Induction: Intravenous  Airway Management Planned: Oral ETT and LMA  Additional Equipment:   Intra-op Plan:   Post-operative Plan: Extubation in OR  Informed Consent: I have reviewed the patients History and Physical, chart, labs and discussed the procedure including the risks, benefits and alternatives for the proposed anesthesia with the patient or authorized representative who has indicated his/her understanding and acceptance.   Dental advisory given  Plan Discussed with: CRNA  Anesthesia Plan Comments:         Anesthesia Quick Evaluation

## 2017-01-08 NOTE — Op Note (Signed)
UMBILICAL HERNIA REPAIR, INSERTION OF MESH  Procedure Note  Sydney Wallace 01/08/2017   Pre-op Diagnosis: umbilical hernia     Post-op Diagnosis: same  Procedure(s): UMBILICAL HERNIA REPAIR INSERTION OF MESH  Surgeon(s): Abigail Miyamotoouglas Emmajane Altamura, MD  Anesthesia: General  Staff:  Circulator: Theresia Boughianne G Joyce, RN Scrub Person: Idell Pickleseborah R Flannagan, CST  Estimated Blood Loss: Minimal               Findings: The patient was found to have Wallace small fascial defect at the umbilicus. The previously incarcerated omentum have been reduced. The hernia was repaired with Wallace 4.3 cm round Prolene ventralex ST patch from [facility].  Procedure: The patient was brought to the operating room and identified as the correct patient. She is placed upon the operating room table and general anesthesia was induced. Her abdomen was then prepped and draped in the usual sterile fashion. I anesthetized the skin above the umbilicus with Marcaine. I then made Wallace longitudinal incision with Wallace scalpel. I took this down to the hernia sac which I excised. The contents bilirubin reduced into the abdominal cavity. The fascial defect was approximately 1 cm in size. I brought Wallace 4.3 cm round Prolene ventral patch from [facility] onto the field. I placed it through the fascia opening and pulled it against the peritoneum with stay ties. The mesh was then sewn in place circumferentially with #1 Novafil sutures. I then cut the stay ties and closed the fascia over the top of the mesh with Wallace #1 Novafil suture as well. I then anesthetized the fascia further with Marcaine. I then closed the subcutaneous tissue with 3-0 Vicryl sutures and closed the skin with Wallace running 4-0 Monocryl. Dermabond was then applied. The patient tolerated the procedure well. All the counts were correct at the procedure. The patient was then extubated in the operating room and taken in Wallace stable condition to the recovery room.             Sydney Wallace   Date: 01/08/2017  Time: 10:44  AM

## 2017-01-08 NOTE — Anesthesia Procedure Notes (Signed)
Procedure Name: LMA Insertion Date/Time: 01/08/2017 10:08 AM Performed by: Arline Ketter D Pre-anesthesia Checklist: Patient identified, Emergency Drugs available, Suction available and Patient being monitored Patient Re-evaluated:Patient Re-evaluated prior to inductionOxygen Delivery Method: Circle system utilized Preoxygenation: Pre-oxygenation with 100% oxygen Intubation Type: IV induction Ventilation: Mask ventilation without difficulty LMA: LMA inserted LMA Size: 3.0 Number of attempts: 1 Airway Equipment and Method: Bite block Placement Confirmation: positive ETCO2 Tube secured with: Tape Dental Injury: Teeth and Oropharynx as per pre-operative assessment

## 2017-01-12 ENCOUNTER — Encounter (HOSPITAL_BASED_OUTPATIENT_CLINIC_OR_DEPARTMENT_OTHER): Payer: Self-pay | Admitting: Surgery

## 2017-04-04 ENCOUNTER — Encounter: Payer: Self-pay | Admitting: Emergency Medicine

## 2017-04-04 ENCOUNTER — Inpatient Hospital Stay
Admission: EM | Admit: 2017-04-04 | Discharge: 2017-04-07 | DRG: 866 | Disposition: A | Discharge status: Home / Self Care | Payer: BLUE CROSS/BLUE SHIELD | Attending: Internal Medicine | Admitting: Internal Medicine

## 2017-04-04 ENCOUNTER — Inpatient Hospital Stay: Payer: BLUE CROSS/BLUE SHIELD

## 2017-04-04 DIAGNOSIS — R7989 Other specified abnormal findings of blood chemistry: Secondary | ICD-10-CM | POA: Diagnosis not present

## 2017-04-04 DIAGNOSIS — K759 Inflammatory liver disease, unspecified: Secondary | ICD-10-CM

## 2017-04-04 DIAGNOSIS — R748 Abnormal levels of other serum enzymes: Secondary | ICD-10-CM | POA: Diagnosis not present

## 2017-04-04 DIAGNOSIS — B349 Viral infection, unspecified: Secondary | ICD-10-CM | POA: Diagnosis present

## 2017-04-04 DIAGNOSIS — A938 Other specified arthropod-borne viral fevers: Secondary | ICD-10-CM | POA: Diagnosis present

## 2017-04-04 DIAGNOSIS — R945 Abnormal results of liver function studies: Secondary | ICD-10-CM

## 2017-04-04 LAB — PROCALCITONIN: Procalcitonin: 0.18 ng/mL

## 2017-04-04 LAB — RAPID HIV SCREEN (HIV 1/2 AB+AG)
HIV 1/2 ANTIBODIES: NONREACTIVE
HIV-1 P24 Antigen - HIV24: NONREACTIVE

## 2017-04-04 LAB — COMPREHENSIVE METABOLIC PANEL
ALBUMIN: 4.2 g/dL (ref 3.5–5.0)
ALK PHOS: 68 U/L (ref 38–126)
ALT: 807 U/L — AB (ref 14–54)
ANION GAP: 5 (ref 5–15)
AST: 839 U/L — ABNORMAL HIGH (ref 15–41)
BUN: 10 mg/dL (ref 6–20)
CALCIUM: 8.8 mg/dL — AB (ref 8.9–10.3)
CO2: 23 mmol/L (ref 22–32)
Chloride: 109 mmol/L (ref 101–111)
Creatinine, Ser: 0.72 mg/dL (ref 0.44–1.00)
GFR calc non Af Amer: >60 mL/min (ref >60)
Glucose, Bld: 96 mg/dL (ref 65–99)
POTASSIUM: 4.2 mmol/L (ref 3.5–5.1)
SODIUM: 137 mmol/L (ref 135–145)
TOTAL PROTEIN: 7.9 g/dL (ref 6.5–8.1)
Total Bilirubin: 0.3 mg/dL (ref 0.3–1.2)

## 2017-04-04 LAB — CBC
HCT: 40.7 % (ref 35.0–47.0)
HEMOGLOBIN: 13.8 g/dL (ref 12.0–16.0)
MCH: 30.2 pg (ref 26.0–34.0)
MCHC: 33.8 g/dL (ref 32.0–36.0)
MCV: 89.2 fL (ref 80.0–100.0)
PLATELETS: 150 10*3/uL (ref 150–440)
RBC: 4.57 MIL/uL (ref 3.80–5.20)
RDW: 14.6 % — ABNORMAL HIGH (ref 11.5–14.5)
WBC: 4.2 10*3/uL (ref 3.6–11.0)

## 2017-04-04 LAB — MONONUCLEOSIS SCREEN: Mono Screen: NEGATIVE

## 2017-04-04 LAB — URINALYSIS, COMPLETE (UACMP) WITH MICROSCOPIC
Bilirubin Urine: NEGATIVE
GLUCOSE, UA: NEGATIVE mg/dL
HGB URINE DIPSTICK: NEGATIVE
Ketones, ur: NEGATIVE mg/dL
NITRITE: NEGATIVE
Protein, ur: NEGATIVE mg/dL
SPECIFIC GRAVITY, URINE: 1.021 (ref 1.005–1.030)
pH: 5 (ref 5.0–8.0)

## 2017-04-04 LAB — POCT PREGNANCY, URINE: Preg Test, Ur: NEGATIVE

## 2017-04-04 MED ORDER — ENOXAPARIN SODIUM 40 MG/0.4ML ~~LOC~~ SOLN
40.0000 mg | SUBCUTANEOUS | Status: DC
  Administered 2017-04-04: 40 mg via SUBCUTANEOUS
  Filled 2017-04-04 (×3): qty 0.4

## 2017-04-04 MED ORDER — MORPHINE SULFATE (PF) 2 MG/ML IV SOLN
2.0000 mg | INTRAVENOUS | Status: DC | PRN
  Administered 2017-04-04 (×2): 2 mg via INTRAVENOUS
  Filled 2017-04-04 (×2): qty 1

## 2017-04-04 MED ORDER — SODIUM CHLORIDE 0.9 % IV SOLN
INTRAVENOUS | Status: DC
  Administered 2017-04-04 – 2017-04-05 (×3): via INTRAVENOUS

## 2017-04-04 MED ORDER — IBUPROFEN 400 MG PO TABS
400.0000 mg | ORAL_TABLET | Freq: Four times a day (QID) | ORAL | Status: DC | PRN
Start: 2017-04-04 — End: 2017-04-05
  Administered 2017-04-04 – 2017-04-05 (×3): 400 mg via ORAL
  Filled 2017-04-04 (×3): qty 1

## 2017-04-04 MED ORDER — ONDANSETRON HCL 4 MG PO TABS: 4.0000 mg | ORAL_TABLET | Freq: Four times a day (QID) | ORAL | Status: DC | PRN

## 2017-04-04 MED ORDER — ONDANSETRON HCL 4 MG/2ML IJ SOLN: 4.0000 mg | Freq: Four times a day (QID) | INTRAMUSCULAR | Status: DC | PRN

## 2017-04-04 MED ORDER — DOXYCYCLINE HYCLATE 100 MG PO TABS
100.0000 mg | ORAL_TABLET | Freq: Two times a day (BID) | ORAL | Status: DC
  Administered 2017-04-04 – 2017-04-07 (×7): 100 mg via ORAL
  Filled 2017-04-04 (×7): qty 1

## 2017-04-04 NOTE — ED Triage Notes (Signed)
Pt arrives ambulatory to triage with c/o fever since Thursday night. Pt reports that she has taken ibuprofen and Theraflu without relief. Pt is smiling in triage with NAD noted at this time.

## 2017-04-04 NOTE — ED Notes (Signed)
Patient with symptoms since Thursday of runny nose, possible fever and generalized malaise. Seen by MD but not given any medicine.

## 2017-04-04 NOTE — ED Notes (Signed)
Pt in ultrasound, will go to floor when pt returns

## 2017-04-04 NOTE — H&P (Signed)
[facility] [place] [facility]   PATIENT NAME: Sydney Wallace    MR#:  [id number]  DATE OF BIRTH:  August 07, 1991  DATE OF ADMISSION:  04/04/2017  PRIMARY CARE PHYSICIAN: Patient, No Pcp Per   REQUESTING/REFERRING PHYSICIAN: Dr. Bayard Malesandolph Brown  CHIEF COMPLAINT:   Chief Complaint  Patient presents with  . Fever    HISTORY OF PRESENT ILLNESS:  Sydney Wallace  is a 26 y.o. female with A significant past medical history presents to hospital secondary to fevers, chills and headaches. Symptoms started about 3 days ago, seen her PCP 2 days ago who advised to take her ibuprofen for possible viral illness. -Has been having ongoing chills at home, denies any recent travel. No eating outside. Works and side effects lifting boxes. No other sick contacts. Denies any nausea, vomiting, chest pain, shortness of breath or cough. Feels intermittent myalgias throughout the day with headaches. Soreness in the back of the head noted. No neck stiffness present. Denies any tick bites. In the ER, noted to have elevated LFTs.  PAST MEDICAL HISTORY:   Past Medical History:  Diagnosis Date  . PONV (postoperative nausea and vomiting)   . Seasonal allergies   . Umbilical hernia 12/2016    PAST SURGICAL HISTORY:   Past Surgical History:  Procedure Laterality Date  . ANTERIOR CRUCIATE LIGAMENT REPAIR Left   . INSERTION OF MESH N/A 01/08/2017   Procedure: INSERTION OF MESH;  Surgeon: Abigail MiyamotoBlackman, Douglas, MD;  Location: [facility];  Service: General;  Laterality: N/A;  . UMBILICAL HERNIA REPAIR N/A 01/08/2017   Procedure: UMBILICAL HERNIA REPAIR;  Surgeon: Abigail MiyamotoBlackman, Douglas, MD;  Location: [facility];  Service: General;  Laterality: N/A;    SOCIAL HISTORY:   Social History  Substance Use Topics  . Smoking status: Never Smoker  . Smokeless tobacco: Never Used  . Alcohol use Yes     Comment: weekends    FAMILY HISTORY:   Maternal Grandmother with  hypertension, both parents healthy  DRUG ALLERGIES:  No Known Allergies  REVIEW OF SYSTEMS:   Review of Systems  Constitutional: Positive for chills, fever and malaise/fatigue. Negative for weight loss.  HENT: Negative for ear discharge, ear pain, hearing loss, nosebleeds and tinnitus.   Eyes: Negative for blurred vision, double vision and photophobia.  Respiratory: Negative for cough, hemoptysis, shortness of breath and wheezing.   Cardiovascular: Negative for chest pain, palpitations, orthopnea and leg swelling.  Gastrointestinal: Negative for abdominal pain, constipation, diarrhea, heartburn, melena, nausea and vomiting.  Genitourinary: Negative for dysuria, frequency, hematuria and urgency.  Musculoskeletal: Negative for back pain, myalgias and neck pain.  Skin: Negative for rash.  Neurological: Positive for headaches. Negative for dizziness, tingling, tremors, sensory change, speech change and focal weakness.  Endo/Heme/Allergies: Does not bruise/bleed easily.  Psychiatric/Behavioral: Negative for depression.    MEDICATIONS AT HOME:   Prior to Admission medications   Medication Sig Start Date End Date Taking? Authorizing Provider  ibuprofen (ADVIL,MOTRIN) 200 MG tablet Take 400-600 mg by mouth every 6 (six) hours as needed for headache or mild pain.   Yes [provider]      VITAL SIGNS:  Blood pressure 117/75, pulse 90, temperature 100.2 F (37.9 C), temperature source Oral, resp. rate 16, height 5' (1.524 m), weight 66.7 kg (147 lb), last menstrual period 03/11/2017, SpO2 100 %.  PHYSICAL EXAMINATION:   Physical Exam  GENERAL:  26 y.o.-year-old patient lying in the bed with no acute distress.  EYES:  Pupils equal, round, reactive to light and accommodation. No scleral icterus. Extraocular muscles intact.  HEENT: Head atraumatic, normocephalic. Oropharynx and nasopharynx clear.  NECK:  Supple, no jugular venous distention. No thyroid enlargement, no tenderness.   LUNGS: Normal breath sounds bilaterally, no wheezing, rales,rhonchi or crepitation. No use of accessory muscles of respiration.  CARDIOVASCULAR: S1, S2 normal. No murmurs, rubs, or gallops.  ABDOMEN: Soft, nontender, nondistended. Bowel sounds present. No organomegaly or mass.  EXTREMITIES: No pedal edema, cyanosis, or clubbing.  NEUROLOGIC: Cranial nerves II through XII are intact. Muscle strength 5/5 in all extremities. Sensation intact. Gait not checked.  PSYCHIATRIC: The patient is alert and oriented x 3.  SKIN: No obvious rash, lesion, or ulcer.  Palpable lymph nodes  In the occipital region  LABORATORY PANEL:   CBC  Recent Labs Lab 04/04/17 0540  WBC 4.2  HGB 13.8  HCT 40.7  PLT 150   ------------------------------------------------------------------------------------------------------------------  Chemistries   Recent Labs Lab 04/04/17 0540  NA 137  K 4.2  CL 109  CO2 23  GLUCOSE 96  BUN 10  CREATININE 0.72  CALCIUM 8.8*  AST 839*  ALT 807*  ALKPHOS 68  BILITOT 0.3   ------------------------------------------------------------------------------------------------------------------  Cardiac Enzymes No results for input(s): TROPONINI in the last 168 hours. ------------------------------------------------------------------------------------------------------------------  RADIOLOGY:  No results found.  EKG:  No orders found for this or any previous visit.  IMPRESSION AND PLAN:   Sydney Wallace  is a 26 y.o. female with A significant past medical history presents to hospital secondary to fevers, chills and headaches.  #1 possible viral illness-would be arthropod born  -started on doxycycline. Check blood cultures, documented spotted fever antibodies -Continue Avapro for now as needed.  #2 elevated LFTs-could be related to viral illness. Check acute hepatitis panel. -Right upper quadrant ultrasound ordered as well  #3 DVT prophylaxis-Lovenox   All  the records are reviewed and case discussed with ED provider. Management plans discussed with the patient, family and they are in agreement.  CODE STATUS: Full code  TOTAL TIME TAKING CARE OF THIS PATIENT: 45 minutes.    Rashaan Wyles M.D on 04/04/2017 at 7:40 AM  Between 7am to 6pm - Pager - [phone]  After 6pm go to [email or web address] - password [email or web address]  [facility]  Office  [phone]  CC: Primary care physician; Patient, No Pcp Per

## 2017-04-04 NOTE — Progress Notes (Signed)
Patient is requesting something else for pain. Paged Dr. Nemiah CommanderKalisetti.

## 2017-04-04 NOTE — ED Provider Notes (Addendum)
[facility] Emergency Department Provider Note    First MD Initiated Contact with Patient 04/04/17 [phone]     (approximate)  I have reviewed the triage vital signs and the nursing notes.   HISTORY  Chief Complaint Fever   HPI Sydney Wallace is a 26 y.o. female presents to the emergency department with fever and chills 3 days generalized fatigue and myalgias. Patient states that she saw her primary care provider who told her to take ibuprofen however no diagnosis was given. Patient admits to tender swollen areas on the posterior portion of her scalp.   Past Medical History:  Diagnosis Date  . PONV (postoperative nausea and vomiting)   . Seasonal allergies   . Umbilical hernia 12/2016    There are no active problems to display for this patient.   Past Surgical History:  Procedure Laterality Date  . ANTERIOR CRUCIATE LIGAMENT REPAIR Left   . INSERTION OF MESH N/A 01/08/2017   Procedure: INSERTION OF MESH;  Surgeon: Abigail MiyamotoBlackman, Douglas, MD;  Location: [facility];  Service: General;  Laterality: N/A;  . UMBILICAL HERNIA REPAIR N/A 01/08/2017   Procedure: UMBILICAL HERNIA REPAIR;  Surgeon: Abigail MiyamotoBlackman, Douglas, MD;  Location: [facility];  Service: General;  Laterality: N/A;    Prior to Admission medications   Medication Sig Start Date End Date Taking? Authorizing Provider  oxyCODONE-acetaminophen (ROXICET) 5-325 MG tablet Take 1-2 tablets by mouth every 4 (four) hours as needed for moderate pain or severe pain. 01/08/17   Abigail MiyamotoBlackman, Douglas, MD    Allergies No known drug allergies No family history on file.  Social History Social History  Substance Use Topics  . Smoking status: Never Smoker  . Smokeless tobacco: Never Used  . Alcohol use Yes     Comment: weekends    Review of Systems Constitutional:Positive for fever/chills Eyes: No visual changes. ENT: No sore throat. Cardiovascular: Denies chest pain. Respiratory:  Denies shortness of breath. Gastrointestinal: No abdominal pain.  No nausea, no vomiting.  No diarrhea.  No constipation. Genitourinary: Negative for dysuria. Musculoskeletal: Negative for neck pain.  Negative for back pain. Positive for generalized muscle aches Integumentary: Negative for rash. Neurological: Negative for headaches, focal weakness or numbness.   ____________________________________________   PHYSICAL EXAM:  VITAL SIGNS: ED Triage Vitals [04/04/17 0436]  Enc Vitals Group     BP 122/67     Pulse Rate 86     Resp 18     Temp 100.2 F (37.9 C)     Temp Source Oral     SpO2 100 %     Weight 66.7 kg (147 lb)     Height 1.524 m (5')     Head Circumference      Peak Flow      Pain Score 9     Pain Loc      Pain Edu?      Excl. in GC?     Constitutional: Alert and oriented. Well appearing and in no acute distress. Eyes: Conjunctivae are normal. PERRL. EOMI. Head: Atraumatic.Palpable occipital lymphadenopathy Ears:  Healthy appearing ear canals and TMs bilaterally Nose: No congestion/rhinnorhea. Mouth/Throat: Mucous membranes are moist.  Oropharynx non-erythematous. Neck: No stridor. Palpable submandibular anterior cervical lymphadenopathy Cardiovascular: Normal rate, regular rhythm. Good peripheral circulation. Grossly normal heart sounds. Respiratory: Normal respiratory effort.  No retractions. Lungs CTAB. Gastrointestinal: Soft and nontender. No distention.  Musculoskeletal: No lower extremity tenderness nor edema. No gross deformities of extremities. Neurologic:  Normal speech  and language. No gross focal neurologic deficits are appreciated.  Skin: Palpable occipital and submandibular lymphadenopathy Psychiatric: Mood and affect are normal. Speech and behavior are normal.  ____________________________________________   LABS (all labs ordered are listed, but only abnormal results are displayed)  Labs Reviewed  CBC - Abnormal; Notable for the following:        Result Value   RDW 14.6 (*)    All other components within normal limits  COMPREHENSIVE METABOLIC PANEL - Abnormal; Notable for the following:    Calcium 8.8 (*)    AST 839 (*)    ALT 807 (*)    All other components within normal limits  URINALYSIS, COMPLETE (UACMP) WITH MICROSCOPIC - Abnormal; Notable for the following:    Color, Urine YELLOW (*)    APPearance HAZY (*)    Leukocytes, UA MODERATE (*)    Bacteria, UA RARE (*)    Squamous Epithelial / LPF 6-30 (*)    All other components within normal limits  MONONUCLEOSIS SCREEN  HEPATITIS PANEL, ACUTE  POCT PREGNANCY, URINE       Procedures   ____________________________________________   INITIAL IMPRESSION / ASSESSMENT AND PLAN / ED COURSE  Pertinent labs & imaging results that were available during my care of the patient were reviewed by me and considered in my medical decision making (see chart for details).  26 year old female presenting with above stated symptoms. Laboratory data notable for an elevated AST and ALT 839 and 870 respectively raising concern for hepatitis. Hepatitis panel drawn. Patient discussed with Dr. Sheryle Haildiamond for hospital admission for further evaluation and management      ____________________________________________  FINAL CLINICAL IMPRESSION(S) / ED DIAGNOSES  Final diagnoses:  Hepatitis     MEDICATIONS GIVEN DURING THIS VISIT:  Medications - No data to display   NEW OUTPATIENT MEDICATIONS STARTED DURING THIS VISIT:  New Prescriptions   No medications on file    Modified Medications   No medications on file    Discontinued Medications   No medications on file     Note:  This document was prepared using Dragon voice recognition software and may include unintentional dictation errors.    Darci CurrentBrown, [place] N, MD 04/04/17 [id number]    Darci CurrentBrown, [place] N, MD 04/04/17 [phone]

## 2017-04-05 ENCOUNTER — Encounter: Payer: Self-pay | Admitting: Gastroenterology

## 2017-04-05 DIAGNOSIS — R748 Abnormal levels of other serum enzymes: Secondary | ICD-10-CM

## 2017-04-05 LAB — COMPREHENSIVE METABOLIC PANEL
ALK PHOS: 76 U/L (ref 38–126)
ALT: 1375 U/L — AB (ref 14–54)
AST: 1194 U/L — ABNORMAL HIGH (ref 15–41)
Albumin: 3.7 g/dL (ref 3.5–5.0)
Anion gap: 4 — ABNORMAL LOW (ref 5–15)
BILIRUBIN TOTAL: 0.2 mg/dL — AB (ref 0.3–1.2)
BUN: 7 mg/dL (ref 6–20)
CALCIUM: 8.7 mg/dL — AB (ref 8.9–10.3)
CO2: 25 mmol/L (ref 22–32)
CREATININE: 0.71 mg/dL (ref 0.44–1.00)
Chloride: 110 mmol/L (ref 101–111)
Glucose, Bld: 99 mg/dL (ref 65–99)
Potassium: 4.3 mmol/L (ref 3.5–5.1)
Sodium: 139 mmol/L (ref 135–145)
TOTAL PROTEIN: 6.8 g/dL (ref 6.5–8.1)

## 2017-04-05 LAB — PROTIME-INR
INR: 1
PROTHROMBIN TIME: 13.2 s (ref 11.4–15.2)

## 2017-04-05 LAB — CBC
HCT: 36.2 % (ref 35.0–47.0)
Hemoglobin: 12.3 g/dL (ref 12.0–16.0)
MCH: 29.6 pg (ref 26.0–34.0)
MCHC: 33.9 g/dL (ref 32.0–36.0)
MCV: 87.2 fL (ref 80.0–100.0)
PLATELETS: 144 10*3/uL — AB (ref 150–440)
RBC: 4.15 MIL/uL (ref 3.80–5.20)
RDW: 14.2 % (ref 11.5–14.5)
WBC: 3 10*3/uL — AB (ref 3.6–11.0)

## 2017-04-05 LAB — ACETAMINOPHEN LEVEL

## 2017-04-05 LAB — CK: CK TOTAL: 88 U/L (ref 38–234)

## 2017-04-05 LAB — HEPATITIS PANEL, ACUTE
HEP B C IGM: NEGATIVE
HEP B S AG: NEGATIVE
Hep A IgM: NEGATIVE

## 2017-04-05 LAB — FERRITIN: Ferritin: 82 ng/mL (ref 11–307)

## 2017-04-05 MED ORDER — IBUPROFEN 400 MG PO TABS
400.0000 mg | ORAL_TABLET | Freq: Four times a day (QID) | ORAL | Status: DC | PRN
  Administered 2017-04-06: 400 mg via ORAL
  Filled 2017-04-05: qty 1

## 2017-04-05 MED ORDER — TRAMADOL HCL 50 MG PO TABS
50.0000 mg | ORAL_TABLET | Freq: Four times a day (QID) | ORAL | Status: DC | PRN
  Administered 2017-04-05: 50 mg via ORAL
  Filled 2017-04-05: qty 1

## 2017-04-05 NOTE — Consult Note (Signed)
Reason for Consult: Abnormal liver enzymes Referring Physician: Dr. Kathreen Devoid is an 26 y.o. female.  HPI: Seen in consultation at the request of Dr. Tressia Miners. The history is obtained through the patient, review of EPIC, and her grandmother who is at the bedside. Dr. Vicente Males recently performed her grandmother's colonoscopy.  She was in her usual state of good health until approximately one week ago. She developed headaches, malaise, myalgias, and tender occipital lymph nodes. She was seen at a primary care provider's office on Thursday and offered reassurance. The recommended ibuprofen originally controlled her myalgias, but, all of the symptoms became progressively worse. She also noted subjective fevers. She came to the ER for further evaluation. She is currently feeling better. She denies any focal symptoms at this time except for the persistent tender lymph nodes.   Her transaminses were originally elevated and worsened today. Total bilirubin, alkaline phosphatase, and coagulation studies are normal.  An abdominal ultrasound shows a normal gallbladder and liver. Testing for acute HAV, HBV, HCV, Mono, and HIV was negative. A pregnancy test was negative. Urinalysis shows no urine bilirubin. Tylenol level was negative.   There is no prior knowledge of liver disease or abnormal liver disease. She donated blood in high school. No prior blood transfusion. She has multiple tattoos, the last placed in a tattoo parlor approximately one month ago. She denies the use of of experimentation with any illicit street drugs. She takes multiple herbal supplementations for muscle building and weight loss. She has used RSP nutrition CLA and L-carnitine for some time. Two weeks ago she started using Testobooster for weight lost.  No known sick contacts. No tick exposure. No recent travel. She had an umbilical hernia repair in May and used Percocet postoperatively but has otherwise had no prescription  medications this year. 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   She carries a history of an abnormal TSH that did not require additional treatment. There is no family history of autoimmune disease or liver disease.   Past Medical History:  Diagnosis Date  . PONV (postoperative nausea and vomiting)   . Seasonal allergies   . Umbilical hernia 98/1191    Past Surgical History:  Procedure Laterality Date  . ANTERIOR CRUCIATE LIGAMENT REPAIR Left   . INSERTION OF MESH N/A 01/08/2017   Procedure: INSERTION OF MESH;  Surgeon: Coralie Keens, MD;  Location: [facility];  Service: General;  Laterality: N/A;  . UMBILICAL HERNIA REPAIR N/A 01/08/2017   Procedure: UMBILICAL HERNIA REPAIR;  Surgeon: Coralie Keens, MD;  Location: [facility];  Service: General;  Laterality: N/A;    History reviewed. No pertinent family history.  Social History:  reports that she has never smoked. She has never used smokeless tobacco. She reports that she drinks alcohol. She reports that she does not use drugs.  Allergies: No Known Allergies  Medications:  I have reviewed the patient's current medications. Prior to Admission:  Prescriptions Prior to Admission  Medication Sig Dispense Refill Last Dose  . ibuprofen (ADVIL,MOTRIN) 200 MG tablet Take 400-600 mg by mouth every 6 (six) hours as needed for headache or mild pain.   prn at prn   Scheduled: . doxycycline  100 mg Oral Q12H  . enoxaparin (LOVENOX) injection  40 mg Subcutaneous Q24H   Continuous:  YNW:GNFAOZHYQ, ondansetron **OR** ondansetron (ZOFRAN) IV, traMADol  Results for orders placed or performed during the hospital encounter of 04/04/17 (from the  past 48 hour(s))  CBC     Status: Abnormal   Collection Time: 04/04/17  5:40 AM  Result Value Ref Range   WBC 4.2 3.6 - 11.0 K/uL   RBC 4.57 3.80 - 5.20 MIL/uL   Hemoglobin 13.8 12.0 - 16.0 g/dL   HCT 40.7 35.0 - 47.0 %    MCV 89.2 80.0 - 100.0 fL   MCH 30.2 26.0 - 34.0 pg   MCHC 33.8 32.0 - 36.0 g/dL   RDW 14.6 (H) 11.5 - 14.5 %   Platelets 150 150 - 440 K/uL  Comprehensive metabolic panel     Status: Abnormal   Collection Time: 04/04/17  5:40 AM  Result Value Ref Range   Sodium 137 135 - 145 mmol/L   Potassium 4.2 3.5 - 5.1 mmol/L   Chloride 109 101 - 111 mmol/L   CO2 23 22 - 32 mmol/L   Glucose, Bld 96 65 - 99 mg/dL   BUN 10 6 - 20 mg/dL   Creatinine, Ser 0.72 0.44 - 1.00 mg/dL   Calcium 8.8 (L) 8.9 - 10.3 mg/dL   Total Protein 7.9 6.5 - 8.1 g/dL   Albumin 4.2 3.5 - 5.0 g/dL   AST 839 (H) 15 - 41 U/L   ALT 807 (H) 14 - 54 U/L   Alkaline Phosphatase 68 38 - 126 U/L   Total Bilirubin 0.3 0.3 - 1.2 mg/dL   GFR calc non Af Amer >60 >60 mL/min   GFR calc Af Amer >60 >60 mL/min    Comment: (NOTE) The eGFR has been calculated using the CKD EPI equation. This calculation has not been validated in all clinical situations. eGFR's persistently <60 mL/min signify possible Chronic Kidney Disease.    Anion gap 5 5 - 15  Mononucleosis screen     Status: None   Collection Time: 04/04/17  5:40 AM  Result Value Ref Range   Mono Screen NEGATIVE NEGATIVE  Procalcitonin - Baseline     Status: None   Collection Time: 04/04/17  5:40 AM  Result Value Ref Range   Procalcitonin 0.18 ng/mL    Comment:        Interpretation: PCT (Procalcitonin) <= 0.5 ng/mL: Systemic infection (sepsis) is not likely. Local bacterial infection is possible. (NOTE)         ICU PCT Algorithm               Non ICU PCT Algorithm    ----------------------------     ------------------------------         PCT < 0.25 ng/mL                 PCT < 0.1 ng/mL     Stopping of antibiotics            Stopping of antibiotics       strongly encouraged.               strongly encouraged.    ----------------------------     ------------------------------       PCT level decrease by               PCT < 0.25 ng/mL       >= 80% from peak PCT        OR PCT 0.25 - 0.5 ng/mL          Stopping of antibiotics  encouraged.     Stopping of antibiotics           encouraged.    ----------------------------     ------------------------------       PCT level decrease by              PCT >= 0.25 ng/mL       < 80% from peak PCT        AND PCT >= 0.5 ng/mL            Continuin g antibiotics                                              encouraged.       Continuing antibiotics            encouraged.    ----------------------------     ------------------------------     PCT level increase compared          PCT > 0.5 ng/mL         with peak PCT AND          PCT >= 0.5 ng/mL             Escalation of antibiotics                                          strongly encouraged.      Escalation of antibiotics        strongly encouraged.   Rapid HIV screen (HIV 1/2 Ab+Ag)     Status: None   Collection Time: 04/04/17  5:40 AM  Result Value Ref Range   HIV-1 P24 Antigen - HIV24 NON REACTIVE NON REACTIVE   HIV 1/2 Antibodies NON REACTIVE NON REACTIVE   Interpretation (HIV Ag Ab)      A non reactive test result means that HIV 1 or HIV 2 antibodies and HIV 1 p24 antigen were not detected in the specimen.  Urinalysis, Complete w Microscopic     Status: Abnormal   Collection Time: 04/04/17  5:51 AM  Result Value Ref Range   Color, Urine YELLOW (A) YELLOW   APPearance HAZY (A) CLEAR   Specific Gravity, Urine 1.021 1.005 - 1.030   pH 5.0 5.0 - 8.0   Glucose, UA NEGATIVE NEGATIVE mg/dL   Hgb urine dipstick NEGATIVE NEGATIVE   Bilirubin Urine NEGATIVE NEGATIVE   Ketones, ur NEGATIVE NEGATIVE mg/dL   Protein, ur NEGATIVE NEGATIVE mg/dL   Nitrite NEGATIVE NEGATIVE   Leukocytes, UA MODERATE (A) NEGATIVE   RBC / HPF 0-5 0 - 5 RBC/hpf   WBC, UA 0-5 0 - 5 WBC/hpf   Bacteria, UA RARE (A) NONE SEEN   Squamous Epithelial / LPF 6-30 (A) NONE SEEN   Mucous PRESENT   Pregnancy, urine POC     Status: None   Collection  Time: 04/04/17  6:01 AM  Result Value Ref Range   Preg Test, Ur NEGATIVE NEGATIVE    Comment:        THE SENSITIVITY OF THIS METHODOLOGY IS >24 mIU/mL   Hepatitis panel, acute     Status: None   Collection Time: 04/04/17  6:56 AM  Result Value Ref Range   Hepatitis B Surface Ag Negative Negative   HCV Ab <0.1 0.0 -  0.9 s/co ratio    Comment: (NOTE)                                  Negative:     < 0.8                             Indeterminate: 0.8 - 0.9                                  Positive:     > 0.9 The CDC recommends that a positive HCV antibody result be followed up with a HCV Nucleic Acid Amplification test (284132). Performed At: [facility] [place], [place] [id number] Lindon Romp MD [id number]    Hep A IgM Negative Negative   Hep B C IgM Negative Negative  Comprehensive metabolic panel     Status: Abnormal   Collection Time: 04/05/17  3:31 AM  Result Value Ref Range   Sodium 139 135 - 145 mmol/L   Potassium 4.3 3.5 - 5.1 mmol/L   Chloride 110 101 - 111 mmol/L   CO2 25 22 - 32 mmol/L   Glucose, Bld 99 65 - 99 mg/dL   BUN 7 6 - 20 mg/dL   Creatinine, Ser 0.71 0.44 - 1.00 mg/dL   Calcium 8.7 (L) 8.9 - 10.3 mg/dL   Total Protein 6.8 6.5 - 8.1 g/dL   Albumin 3.7 3.5 - 5.0 g/dL   AST 1,194 (H) 15 - 41 U/L   ALT 1,375 (H) 14 - 54 U/L   Alkaline Phosphatase 76 38 - 126 U/L   Total Bilirubin 0.2 (L) 0.3 - 1.2 mg/dL   GFR calc non Af Amer >60 >60 mL/min   GFR calc Af Amer >60 >60 mL/min    Comment: (NOTE) The eGFR has been calculated using the CKD EPI equation. This calculation has not been validated in all clinical situations. eGFR's persistently <60 mL/min signify possible Chronic Kidney Disease.    Anion gap 4 (L) 5 - 15  CBC     Status: Abnormal   Collection Time: 04/05/17  3:31 AM  Result Value Ref Range   WBC 3.0 (L) 3.6 - 11.0 K/uL   RBC 4.15 3.80 - 5.20 MIL/uL   Hemoglobin 12.3 12.0 - 16.0 g/dL   HCT 36.2 35.0 - 47.0 %    MCV 87.2 80.0 - 100.0 fL   MCH 29.6 26.0 - 34.0 pg   MCHC 33.9 32.0 - 36.0 g/dL   RDW 14.2 11.5 - 14.5 %   Platelets 144 (L) 150 - 440 K/uL  Acetaminophen level     Status: Abnormal   Collection Time: 04/05/17  7:25 AM  Result Value Ref Range   Acetaminophen (Tylenol), Serum <10 (L) 10 - 30 ug/mL    Comment:        THERAPEUTIC CONCENTRATIONS VARY SIGNIFICANTLY. A RANGE OF 10-30 ug/mL MAY BE AN EFFECTIVE CONCENTRATION FOR MANY PATIENTS. HOWEVER, SOME ARE BEST TREATED AT CONCENTRATIONS OUTSIDE THIS RANGE. ACETAMINOPHEN CONCENTRATIONS >150 ug/mL AT 4 HOURS AFTER INGESTION AND >50 ug/mL AT 12 HOURS AFTER INGESTION ARE OFTEN ASSOCIATED WITH TOXIC REACTIONS.   Ferritin     Status: None   Collection Time: 04/05/17  7:25 AM  Result Value Ref Range   Ferritin 82 11 - 307 ng/mL  CK     Status: None   Collection Time: 04/05/17  7:25 AM  Result Value Ref Range   Total CK 88 38 - 234 U/L    US Abdomen Limited Ruq  Result Date: 04/04/2017 CLINICAL DATA:  Elevated LFTs EXAM: ULTRASOUND ABDOMEN LIMITED RIGHT UPPER QUADRANT COMPARISON:  None. FINDINGS: Gallbladder: No gallstones or wall thickening visualized. No sonographic Murphy sign noted by sonographer. Common bile duct: Diameter: Normal caliber, 2 mm Liver: No focal lesion identified. Within normal limits in parenchymal echogenicity. IMPRESSION: Normal right upper quadrant ultrasound. Electronically Signed   By: Rolm Baptise M.D.   On: 04/04/2017 09:15    Review of Systems  Constitutional: Positive for fever and malaise/fatigue. Negative for diaphoresis and weight loss.  HENT: Negative for hearing loss.   Eyes: Negative for blurred vision and double vision.  Respiratory: Negative for cough and hemoptysis.   Cardiovascular: Negative for chest pain and palpitations.  Gastrointestinal: Negative for abdominal pain, blood in stool, constipation, diarrhea, heartburn, melena, nausea and vomiting.  Genitourinary: Negative for dysuria and  urgency.  Musculoskeletal: Positive for myalgias. Negative for joint pain and neck pain.  Skin: Negative for itching and rash.  Neurological: Positive for headaches. Negative for dizziness and weakness.  Endo/Heme/Allergies: Negative for polydipsia. Does not bruise/bleed easily.  Psychiatric/Behavioral: Negative for depression. The patient does not have insomnia.    Blood pressure (!) 112/51, pulse (!) 55, temperature 98.5 F (36.9 C), temperature source Oral, resp. rate 18, height _0  (1.626 m), weight 147 lb (66.7 kg), last menstrual period 03/11/2017, SpO2 100 %. Physical Exam  Nursing note and vitals reviewed. Constitutional: She is oriented to person, place, and time. She appears well-developed and well-nourished. No distress.  HENT:  Head: Normocephalic and atraumatic.  Mouth/Throat: No oropharyngeal exudate.  Eyes: Conjunctivae are normal. No scleral icterus.  Neck: Normal range of motion. Neck supple. No thyromegaly present.  Cardiovascular: Normal rate and regular rhythm.   Respiratory: Effort normal and breath sounds normal.  GI: Soft. Bowel sounds are normal. She exhibits no distension and no mass. There is no tenderness. There is no rebound and no guarding.  No hepatosplenomegaly  Musculoskeletal: Normal range of motion. She exhibits no edema.  Lymphadenopathy:       Right: No inguinal adenopathy present.       Left: No inguinal adenopathy present.  No umbilical lymphadenopathy  Neurological: She is alert and oriented to person, place, and time.  Skin: Skin is dry. No rash noted.  No palmar erythema or spider angioma  Psychiatric: She has a normal mood and affect. Thought content normal.    Assessment/Plan: Abnormal transaminases Viral symptoms Recent placement of a tattoo Multiple herbal therapies  No history of chronic liver disease by history, physical, labs, or imaging.  Suspect her liver enzymes are related to an acute viral syndrome. Must obtain coagulation  studies as a marker for hepatic synthetic function. Suspect this to be an unidentified virus (SSV - some sort of virus) given her labs results thus far. However, she needs additional testing to exclude HCV, as it can take several weeks for the antibody to be positive. Drug induced liver injury is also a possibility, and I counseled her to avoid all herbal agents. If this is DILI, there is some reassurance in her normal bilirubin.  I believe that autoimmune hepatitis is less likely without a globulin gap, but, must be excluded as it would need specific treatment.   Therefore, will proceed with HCV RNA viral  load, ANA, IgG, LKM antibody, Anti smooth muscle antibody, PT/INR. If her transaminases continue to increase, will need to consider cross sectional imaging +/- liver biopsy.   Thank you for allowing me the opportunity to participate in [facility]' care. Dr. Vicente Males will see the patient tomorrow. Please call with any additional questions or concerns.   Thornton Park 04/05/2017, 3:38 PM

## 2017-04-05 NOTE — Progress Notes (Signed)
[facility] [place] [facility]   PATIENT NAME: Sydney Wallace    MR#:  [id number]  DATE OF BIRTH:  1991-08-27  SUBJECTIVE:  CHIEF COMPLAINT:   Chief Complaint  Patient presents with  . Fever   - Low-grade fevers, headache and myalgia still persistent but improved. -LFTs so much elevated  REVIEW OF SYSTEMS:  Review of Systems  Constitutional: Positive for fever. Negative for chills and malaise/fatigue.  HENT: Negative for ear discharge, ear pain, hearing loss and nosebleeds.   Eyes: Negative for blurred vision.  Respiratory: Negative for cough, shortness of breath and wheezing.   Cardiovascular: Negative for chest pain, palpitations and leg swelling.  Gastrointestinal: Negative for abdominal pain, constipation, diarrhea, nausea and vomiting.  Genitourinary: Negative for dysuria.  Musculoskeletal: Positive for myalgias.  Neurological: Positive for headaches. Negative for dizziness, speech change, focal weakness and seizures.  Psychiatric/Behavioral: Negative for depression.    DRUG ALLERGIES:  No Known Allergies  VITALS:  Blood pressure (!) 99/52, pulse (!) 57, temperature 98.1 F (36.7 C), temperature source Oral, resp. rate 18, height 5\' 4"  (1.626 m), weight 66.7 kg (147 lb), last menstrual period 03/11/2017, SpO2 98 %.  PHYSICAL EXAMINATION:  Physical Exam  GENERAL:  26 y.o.-year-old patient lying in the bed with no acute distress.  EYES: Pupils equal, round, reactive to light and accommodation. No scleral icterus. Extraocular muscles intact.  HEENT: Head atraumatic, normocephalic. Oropharynx and nasopharynx clear.  NECK:  Supple, no jugular venous distention. No thyroid enlargement, no tenderness.  LUNGS: Normal breath sounds bilaterally, no wheezing, rales,rhonchi or crepitation. No use of accessory muscles of respiration.  CARDIOVASCULAR: S1, S2 normal. No murmurs, rubs, or gallops.  ABDOMEN: Soft, nontender, nondistended. Bowel sounds  present. No organomegaly or mass.  EXTREMITIES: No pedal edema, cyanosis, or clubbing.  NEUROLOGIC: Cranial nerves II through XII are intact. Muscle strength 5/5 in all extremities. Sensation intact. Gait not checked.  PSYCHIATRIC: The patient is alert and oriented x 3.  SKIN: No obvious rash, lesion, or ulcer.    LABORATORY PANEL:   CBC  Recent Labs Lab 04/05/17 0331  WBC 3.0*  HGB 12.3  HCT 36.2  PLT 144*   ------------------------------------------------------------------------------------------------------------------  Chemistries   Recent Labs Lab 04/05/17 0331  NA 139  K 4.3  CL 110  CO2 25  GLUCOSE 99  BUN 7  CREATININE 0.71  CALCIUM 8.7*  AST 1,194*  ALT 1,375*  ALKPHOS 76  BILITOT 0.2*   ------------------------------------------------------------------------------------------------------------------  Cardiac Enzymes No results for input(s): TROPONINI in the last 168 hours. ------------------------------------------------------------------------------------------------------------------  RADIOLOGY:  [place] Abdomen Limited Ruq  Result Date: 04/04/2017 CLINICAL DATA:  Elevated LFTs EXAM: ULTRASOUND ABDOMEN LIMITED RIGHT UPPER QUADRANT COMPARISON:  None. FINDINGS: Gallbladder: No gallstones or wall thickening visualized. No sonographic Murphy sign noted by sonographer. Common bile duct: Diameter: Normal caliber, 2 mm Liver: No focal lesion identified. Within normal limits in parenchymal echogenicity. IMPRESSION: Normal right upper quadrant ultrasound. Electronically Signed   By: Charlett NoseKevin  Dover M.D.   On: 04/04/2017 09:15    EKG:  No orders found for this or any previous visit.  ASSESSMENT AND PLAN:    Sydney Leaayriah Shimko  is a 26 y.o. female with A significant past medical history presents to hospital secondary to fevers, chills and headaches.  #1 possible viral illness- denies any tick bites or recent travel -on doxycycline. Check blood cultures, rocky  mountain spotted fever antibodies -Continue ibuprofen prn for now.  #2 elevated LFTs-could be related to viral  illness. Pending acute hepatitis panel. -Right upper quadrant ultrasound shows that liver is normal -[place] oh plus min, ferritin, anti-smooth muscle antibody and antimicrosomal antibody ordered. -Alkaline phosphatase and bilirubin are within normal limits -Due to worsening LFTs, GI consult requested  #3 DVT prophylaxis-Lovenox     All the records are reviewed and case discussed with Care Management/Social Workerr. Management plans discussed with the patient, family and they are in agreement.  CODE STATUS: Full code  TOTAL TIME TAKING CARE OF THIS PATIENT: 28 minutes.   POSSIBLE D/C IN 1-2 DAYS, DEPENDING ON CLINICAL CONDITION.   Ilee Randleman M.D on 04/05/2017 at 10:58 AM  Between 7am to 6pm - Pager - [phone]  After 6pm go to [email or web address] - password [email or web address]  [facility]  Office  [phone]  CC: Primary care physician; Patient, No Pcp Per

## 2017-04-06 DIAGNOSIS — R7989 Other specified abnormal findings of blood chemistry: Secondary | ICD-10-CM

## 2017-04-06 LAB — COMPREHENSIVE METABOLIC PANEL
ALK PHOS: 83 U/L (ref 38–126)
ALT: 1262 U/L — ABNORMAL HIGH (ref 14–54)
ANION GAP: 7 (ref 5–15)
AST: 710 U/L — ABNORMAL HIGH (ref 15–41)
Albumin: 3.8 g/dL (ref 3.5–5.0)
BILIRUBIN TOTAL: 0.4 mg/dL (ref 0.3–1.2)
BUN: 8 mg/dL (ref 6–20)
CALCIUM: 9 mg/dL (ref 8.9–10.3)
CO2: 25 mmol/L (ref 22–32)
Chloride: 107 mmol/L (ref 101–111)
Creatinine, Ser: 0.76 mg/dL (ref 0.44–1.00)
GLUCOSE: 95 mg/dL (ref 65–99)
Potassium: 4.1 mmol/L (ref 3.5–5.1)
Sodium: 139 mmol/L (ref 135–145)
TOTAL PROTEIN: 7.1 g/dL (ref 6.5–8.1)

## 2017-04-06 LAB — IGG, IGA, IGM
IGA: 327 mg/dL (ref 87–352)
IGG (IMMUNOGLOBIN G), SERUM: 1346 mg/dL (ref 700–1600)
IgM, Serum: 135 mg/dL (ref 26–217)

## 2017-04-06 LAB — PROTIME-INR
INR: 0.99
PROTHROMBIN TIME: 13.1 s (ref 11.4–15.2)

## 2017-04-06 LAB — PROCALCITONIN: Procalcitonin: 0.1 ng/mL

## 2017-04-06 LAB — CERULOPLASMIN: CERULOPLASMIN: 28.2 mg/dL (ref 19.0–39.0)

## 2017-04-06 NOTE — Progress Notes (Signed)
   Sydney MoodKiran Mykal Kirchman MD, MRCP(U.K) [address]  [address]  [place], [place] [address]  Main: [phone]    Sydney Wallace is being followed for abnormal LFT's  Day 1 of follow up   Subjective: Feels well , no complaints    Objective: Vital signs in last 24 hours: Vitals:   04/05/17 0754 04/05/17 1419 04/05/17 2307 04/06/17 0746  BP: (!) 99/52 (!) 112/51 (!) 112/58 (!) 105/51  Pulse: (!) 57 (!) 55 62 74  Resp: 18 18 18 20   Temp: 98.1 F (36.7 C) 98.5 F (36.9 C) 97.9 F (36.6 C) 98.4 F (36.9 C)  TempSrc: Oral Oral Oral Oral  SpO2: 98% 100% 100% 100%  Weight:      Height:       Weight change:   Intake/Output Summary (Last 24 hours) at 04/06/17 1049 Last data filed at 04/06/17 0800  Gross per 24 hour  Intake              360 ml  Output                0 ml  Net              360 ml     Exam: Heart:: Regular rate and rhythm, S1S2 present or without murmur or extra heart sounds Lungs: normal, clear to auscultation and clear to auscultation and percussion Abdomen: soft, nontender, normal bowel sounds   Lab Results: CMP Latest Ref Rng & Units 04/06/2017 04/05/2017 04/04/2017  Glucose 65 - 99 mg/dL 95 99 96  BUN 6 - 20 mg/dL 8 7 10   Creatinine 0.44 - 1.00 mg/dL 9.140.76 7.820.71 9.560.72  Sodium 135 - 145 mmol/L 139 139 137  Potassium 3.5 - 5.1 mmol/L 4.1 4.3 4.2  Chloride 101 - 111 mmol/L 107 110 109  CO2 22 - 32 mmol/L 25 25 23   Calcium 8.9 - 10.3 mg/dL 9.0 2.1(H8.7(L) 0.8(M8.8(L)  Total Protein 6.5 - 8.1 g/dL 7.1 6.8 7.9  Total Bilirubin 0.3 - 1.2 mg/dL 0.4 5.7(Q0.2(L) 0.3  Alkaline Phos 38 - 126 U/L 83 76 68  AST 15 - 41 U/L 710(H) 1,194(H) 839(H)  ALT 14 - 54 U/L 1,262(H) 1,375(H) 807(H)    Micro Results: No results found for this or any previous visit (from the past 240 hour(s)). Studies/Results: No results found. Medications: I have reviewed the patient's current medications. Scheduled Meds: . doxycycline  100 mg Oral Q12H  . enoxaparin (LOVENOX) injection  40 mg Subcutaneous  Q24H   Continuous Infusions: PRN Meds:.ibuprofen, ondansetron **OR** ondansetron (ZOFRAN) IV, traMADol   Assessment: Active Problems:   Other specified arthropod-borne viral fevers   Sydney Wallace 25 y.o. ,female admitted with a history of 1 week of malaise, fatigue , enlarged occipital lymph nodes , GI been consulted for transaminitis with normal bilirubin and INR.She said today when questioned about any herbal supplements that she uses "Alpha jym" supplement , a quick google search revealed reports of abnormal liver tests associated with it.   Plan: 1. LFT's are improving continue to follow with daily INR 2. Check HSV,EBC,CMV,VZV serology , follow up pending autoimmune screen  3. Stop all herbal supplements- if trend to decline is seen tomorrow can go home with repeat LFT's on Thursday to ensure the trend continues    LOS: 2 days   Sydney MoodKiran Tanay Wallace 04/06/2017, 10:49 AM

## 2017-04-06 NOTE — Progress Notes (Signed)
[facility] - [place] at [facility]   PATIENT NAME: Sydney Wallace    MR#:  [id number]  DATE OF BIRTH:  [birth year]  SUBJECTIVE:  CHIEF COMPLAINT:   Chief Complaint  Patient presents with  . Fever   -no further fevers, feels better today- headache and myalgias improving - Lfts some better today  REVIEW OF SYSTEMS:  Review of Systems  Constitutional: Negative for chills, fever and malaise/fatigue.  HENT: Negative for ear discharge, ear pain, hearing loss and nosebleeds.   Eyes: Negative for blurred vision.  Respiratory: Negative for cough, shortness of breath and wheezing.   Cardiovascular: Negative for chest pain, palpitations and leg swelling.  Gastrointestinal: Negative for abdominal pain, constipation, diarrhea, nausea and vomiting.  Genitourinary: Negative for dysuria.  Musculoskeletal: Positive for myalgias.  Neurological: Positive for headaches. Negative for dizziness, speech change, focal weakness and seizures.  Psychiatric/Behavioral: Negative for depression.    DRUG ALLERGIES:  No Known Allergies  VITALS:  Blood pressure (!) 105/51, pulse 74, temperature 98.4 F (36.9 C), temperature source Oral, resp. rate 20, height 5\' 4"  (1.626 m), weight 66.7 kg (147 lb), last menstrual period 03/11/2017, SpO2 100 %.  PHYSICAL EXAMINATION:  Physical Exam  GENERAL:  26 y.o.-year-old patient lying in the bed with no acute distress.  EYES: Pupils equal, round, reactive to light and accommodation. No scleral icterus. Extraocular muscles intact.  HEENT: Head atraumatic, normocephalic. Oropharynx and nasopharynx clear.  NECK:  Supple, no jugular venous distention. No thyroid enlargement, no tenderness.  LUNGS: Normal breath sounds bilaterally, no wheezing, rales,rhonchi or crepitation. No use of accessory muscles of respiration.  CARDIOVASCULAR: S1, S2 normal. No murmurs, rubs, or gallops.  ABDOMEN: Soft, nontender, nondistended. Bowel sounds present. No  organomegaly or mass.  EXTREMITIES: No pedal edema, cyanosis, or clubbing.  NEUROLOGIC: Cranial nerves II through XII are intact. Muscle strength 5/5 in all extremities. Sensation intact. Gait not checked.  PSYCHIATRIC: The patient is alert and oriented x 3.  SKIN: No obvious rash, lesion, or ulcer.    LABORATORY PANEL:   CBC  Recent Labs Lab 04/05/17 0331  WBC 3.0*  HGB 12.3  HCT 36.2  PLT 144*   ------------------------------------------------------------------------------------------------------------------  Chemistries   Recent Labs Lab 04/06/17 0502  NA 139  K 4.1  CL 107  CO2 25  GLUCOSE 95  BUN 8  CREATININE 0.76  CALCIUM 9.0  AST 710*  ALT 1,262*  ALKPHOS 83  BILITOT 0.4   ------------------------------------------------------------------------------------------------------------------  Cardiac Enzymes No results for input(s): TROPONINI in the last 168 hours. ------------------------------------------------------------------------------------------------------------------  RADIOLOGY:  [place] Abdomen Limited Ruq  Result Date: 04/04/2017 CLINICAL DATA:  Elevated LFTs EXAM: ULTRASOUND ABDOMEN LIMITED RIGHT UPPER QUADRANT COMPARISON:  None. FINDINGS: Gallbladder: No gallstones or wall thickening visualized. No sonographic Murphy sign noted by sonographer. Common bile duct: Diameter: Normal caliber, 2 mm Liver: No focal lesion identified. Within normal limits in parenchymal echogenicity. IMPRESSION: Normal right upper quadrant ultrasound. Electronically Signed   By: Charlett NoseKevin  Dover M.D.   On: 04/04/2017 09:15    EKG:  No orders found for this or any previous visit.  ASSESSMENT AND PLAN:   Sydney Wallace  is a 26 y.o. female with A significant past medical history presents to hospital secondary to fevers, chills and headaches.  #1 possible viral illness- denies any tick bites or recent travel -on doxycycline. Negative blood cultures, and rocky mountain spotted  fever antibodies -Continue ibuprofen prn for now.  #2 elevated LFTs-could be related to viral illness. Also  concern for drug-induced liver injury by herbal products that she has been taking recently. -Right upper quadrant ultrasound shows that liver is normal -ceruloplasmin, ferritin, anti-smooth muscle antibody and antimicrosomal antibody ordered. -Acute hepatitis panel is negative.-- -History of tattoos, hepatitis C RNA has been ordered -Alkaline phosphatase and bilirubin are within normal limits, and no evidence of obstruction -Appreciate GI consult. Improving LFTs at this time  #3 DVT prophylaxis-Lovenox     All the records are reviewed and case discussed with Care Management/Social Workerr. Management plans discussed with the patient, family and they are in agreement.  CODE STATUS: Full code  TOTAL TIME TAKING CARE OF THIS PATIENT: 26 minutes.   POSSIBLE D/C TOMORROW, DEPENDING ON CLINICAL CONDITION.   Kjirsten Bloodgood M.D on 04/06/2017 at 8:21 AM  Between 7am to 6pm - Pager - [phone]  After 6pm go to [email or web address] - password [email or web address]  [facility] [place] [facility]  Office  [phone]  CC: Primary care physician; Patient, No Pcp Per

## 2017-04-07 LAB — HEPATITIS C VRS RNA DETECT BY PCR-QUAL: Hepatitis C Vrs RNA by PCR-Qual: NEGATIVE

## 2017-04-07 LAB — EBV AB TO VIRAL CAPSID AG PNL, IGG+IGM: EBV VCA IGG: 134 U/mL — AB (ref 0.0–17.9)

## 2017-04-07 LAB — ANTI-MICROSOMAL ANTIBODY LIVER / KIDNEY: LKM1 AB: 2.4 U (ref 0.0–20.0)

## 2017-04-07 LAB — ANTI-SMOOTH MUSCLE ANTIBODY, IGG: F-ACTIN AB IGG: 10 U (ref 0–19)

## 2017-04-07 LAB — ENA+DNA/DS+SJORGEN'S
Ribonucleic Protein: 3.5 AI — ABNORMAL HIGH (ref 0.0–0.9)
SSB (La) (ENA) Antibody, IgG: <0.2 AI (ref 0.0–0.9)
ds DNA Ab: <1 IU/mL (ref 0–9)

## 2017-04-07 LAB — COMPREHENSIVE METABOLIC PANEL
ALBUMIN: 4.1 g/dL (ref 3.5–5.0)
ALK PHOS: 89 U/L (ref 38–126)
ALT: 1062 U/L — ABNORMAL HIGH (ref 14–54)
ANION GAP: 7 (ref 5–15)
AST: 453 U/L — ABNORMAL HIGH (ref 15–41)
BILIRUBIN TOTAL: 0.4 mg/dL (ref 0.3–1.2)
BUN: 11 mg/dL (ref 6–20)
CALCIUM: 9.1 mg/dL (ref 8.9–10.3)
CO2: 26 mmol/L (ref 22–32)
Chloride: 106 mmol/L (ref 101–111)
Creatinine, Ser: 0.76 mg/dL (ref 0.44–1.00)
GFR calc non Af Amer: >60 mL/min (ref >60)
GLUCOSE: 96 mg/dL (ref 65–99)
POTASSIUM: 4.3 mmol/L (ref 3.5–5.1)
SODIUM: 139 mmol/L (ref 135–145)
TOTAL PROTEIN: 7.4 g/dL (ref 6.5–8.1)

## 2017-04-07 LAB — ANA W/REFLEX: ANA: POSITIVE — AB

## 2017-04-07 LAB — ROCKY MTN SPOTTED FVR ABS PNL(IGG+IGM)
RMSF IgG: NEGATIVE
RMSF IgM: 0.58 index (ref 0.00–0.89)

## 2017-04-07 LAB — CMV IGM: CMV IgM: <30 AU/mL (ref 0.0–29.9)

## 2017-04-07 NOTE — Progress Notes (Signed)
Pt alert and oriented. Medicated for back pain with good results. No complaints of headach during the night. Able to sleep in between care.

## 2017-04-07 NOTE — Progress Notes (Signed)
     Sydney Wallace was admitted to the [facility] on 04/04/2017 for an acute medical condition and is being Discharged on  04/07/2017 . She will need another 2 days for recovery and so advised to stay away from work until then. So please excuse her from work for the above  Days. Should be able to return to work without any restrictions from 04/10/17.  Call Sydney Baasadhika [name]  MD, [facility] at  [phone] with questions.  Sydney BaasKALISETTI,Laden Fieldhouse M.D on 04/07/2017,at 12:39 PM  [facility] [address], [place] [id number]

## 2017-04-07 NOTE — Progress Notes (Signed)
Discharge orders reviewed with patient. Prescription for lab and work notes given to patient as well as printed AVS. IV removed. All belongings packed. Patient escorted to visitor entrance.   Stephannie PetersSusana G Patino Hernandez, RN

## 2017-04-07 NOTE — Discharge Summary (Signed)
[facility] - [place] at [facility]   PATIENT NAME: Sydney Wallace    MR#:  [id number]  DATE OF BIRTH:  1991-02-12  DATE OF ADMISSION:  04/04/2017   ADMITTING PHYSICIAN: Enid Baasadhika Yessica Putnam, MD  DATE OF DISCHARGE: 04/07/2017  1:02 PM  PRIMARY CARE PHYSICIAN: Patient, No Pcp Per   ADMISSION DIAGNOSIS:   Hepatitis [K75.9] Elevated LFTs [R79.89]  DISCHARGE DIAGNOSIS:   Active Problems:   Other specified arthropod-borne viral fevers   SECONDARY DIAGNOSIS:   Past Medical History:  Diagnosis Date  . PONV (postoperative nausea and vomiting)   . Seasonal allergies   . Umbilical hernia 12/2016    HOSPITAL COURSE:   Desmond Dikeayriah Hinesis a 26 y.o. femalewith A significant past medical history presents to hospital secondary to fevers, chills and headaches.  #1 possible viral illness- denies any tick bites or recent travel -received doxycycline. Negative blood cultures, and rocky mountain spotted fever antibodies -. Fevers are resolved. Discontinue doxycycline at discharge  #2 elevated LFTs:  concern for drug-induced liver injury by herbal products that she has been taking recently. -Right upper quadrant ultrasound shows that liver is normal -ceruloplasmin, ferritin, anti-smooth muscle antibody and antimicrosomal antibody are normal. -Acute hepatitis panel is negative.-- -History of tattoos, hepatitis C RNA has been ordered -Alkaline phosphatase and bilirubin are within normal limits, and no evidence of obstruction -Appreciate GI consult. Improving LFTs at this time- follow-up LFTs in 3-4 days.  Patient feeling well and anxious to go home today  DISCHARGE CONDITIONS:   Guarded  CONSULTS OBTAINED:   Treatment Team:  Tressia DanasBeavers, Kimberly, MD  DRUG ALLERGIES:   No Known Allergies DISCHARGE MEDICATIONS:   Allergies as of 04/07/2017   No Known Allergies     Medication List    STOP taking these medications   ibuprofen 200 MG tablet Commonly known  as:  ADVIL,MOTRIN        DISCHARGE INSTRUCTIONS:   1. PCP follow-up in 1-2 weeks 2. LFTs followed in 3-4 days  DIET:   Regular diet  ACTIVITY:   Activity as tolerated  OXYGEN:   Home Oxygen: No.  Oxygen Delivery: room air  DISCHARGE LOCATION:   home   If you experience worsening of your admission symptoms, develop shortness of breath, life threatening emergency, suicidal or homicidal thoughts you must seek medical attention immediately by calling 911 or calling your MD immediately  if symptoms less severe.  You Must read complete instructions/literature along with all the possible adverse reactions/side effects for all the Medicines you take and that have been prescribed to you. Take any new Medicines after you have completely understood and accpet all the possible adverse reactions/side effects.   Please note  You were cared for by a hospitalist during your hospital stay. If you have any questions about your discharge medications or the care you received while you were in the hospital after you are discharged, you can call the unit and asked to speak with the hospitalist on call if the hospitalist that took care of you is not available. Once you are discharged, your primary care physician will handle any further medical issues. Please note that NO REFILLS for any discharge medications will be authorized once you are discharged, as it is imperative that you return to your primary care physician (or establish a relationship with a primary care physician if you do not have one) for your aftercare needs so that they can reassess your need for medications and monitor your lab values.  On the day of Discharge:  VITAL SIGNS:   Blood pressure 106/60, pulse 64, temperature 98.1 F (36.7 C), temperature source Oral, resp. rate 16, height 5\' 4"  (1.626 m), weight 66.7 kg (147 lb), last menstrual period 03/11/2017, SpO2 100 %.  PHYSICAL EXAMINATION:   GENERAL:  26 y.o.-year-old  patient lying in the bed with no acute distress.  EYES: Pupils equal, round, reactive to light and accommodation. No scleral icterus. Extraocular muscles intact.  HEENT: Head atraumatic, normocephalic. Oropharynx and nasopharynx clear.  NECK:  Supple, no jugular venous distention. No thyroid enlargement, no tenderness.  LUNGS: Normal breath sounds bilaterally, no wheezing, rales,rhonchi or crepitation. No use of accessory muscles of respiration.  CARDIOVASCULAR: S1, S2 normal. No murmurs, rubs, or gallops.  ABDOMEN: Soft, nontender, nondistended. Bowel sounds present. No organomegaly or mass.  EXTREMITIES: No pedal edema, cyanosis, or clubbing.  NEUROLOGIC: Cranial nerves II through XII are intact. Muscle strength 5/5 in all extremities. Sensation intact. Gait not checked.  PSYCHIATRIC: The patient is alert and oriented x 3.  SKIN: No obvious rash, lesion, or ulcer.   DATA REVIEW:   CBC  Recent Labs Lab 04/05/17 0331  WBC 3.0*  HGB 12.3  HCT 36.2  PLT 144*    Chemistries   Recent Labs Lab 04/07/17 0331  NA 139  K 4.3  CL 106  CO2 26  GLUCOSE 96  BUN 11  CREATININE 0.76  CALCIUM 9.1  AST 453*  ALT 1,062*  ALKPHOS 89  BILITOT 0.4     Microbiology Results  No results found for this or any previous visit.  RADIOLOGY:  No results found.   Management plans discussed with the patient, family and they are in agreement.  CODE STATUS:     Code Status Orders        Start     Ordered   04/04/17 0918  Full code  Continuous     04/04/17 0917    Code Status History    Date Active Date Inactive Code Status Order ID Comments User Context   This patient has a current code status but no historical code status.      TOTAL TIME TAKING CARE OF THIS PATIENT: 37 minutes.    Avanni Turnbaugh M.D on 04/07/2017 at 2:10 PM  Between 7am to 6pm - Pager - [phone]  After 6pm go to [email or web address] - Social research officer, governmentpassword [email or web address]  [facility]  Office   [phone]  CC: Primary care physician; Patient, No Pcp Per   Note: This dictation was prepared with Dragon dictation along with smaller phrase technology. Any transcriptional errors that result from this process are unintentional.

## 2017-04-08 LAB — VARICELLA-ZOSTER BY PCR: VARICELLA-ZOSTER, PCR: NEGATIVE

## 2017-04-08 LAB — HSV(HERPES SIMPLEX VRS) I + II AB-IGM: HSVI/II COMB AB IGM: 1.2 ratio — AB (ref 0.00–0.90)

## 2017-04-08 LAB — CMV DNA BY PCR, QUALITATIVE: CMV DNA QL PCR: NEGATIVE

## 2017-04-10 ENCOUNTER — Other Ambulatory Visit: Payer: Self-pay

## 2017-04-10 ENCOUNTER — Telehealth: Payer: Self-pay

## 2017-04-10 NOTE — Telephone Encounter (Signed)
Advised patient of results per Dr. Tobi BastosAnna.   Explained HSV and advised patient to see gynecologist for management/treatment.  Patient states no current outbreaks. She's feeling better.   Repeat LFT labs needed. Patient to callback to schedule labs.

## 2017-04-10 NOTE — Telephone Encounter (Signed)
-----   Message from Wyline MoodKiran Anna, MD sent at 04/09/2017  7:14 PM EDT ----- Inform herpes antibody is positive for acute infection- enquire how she is doing and repeat LFT

## 2018-03-11 ENCOUNTER — Emergency Department (HOSPITAL_COMMUNITY): Payer: Self-pay

## 2018-03-11 ENCOUNTER — Emergency Department (HOSPITAL_COMMUNITY)
Admission: EM | Admit: 2018-03-11 | Discharge: 2018-03-11 | Disposition: A | Discharge status: Home / Self Care | Payer: Self-pay | Attending: Emergency Medicine | Admitting: Emergency Medicine

## 2018-03-11 ENCOUNTER — Encounter (HOSPITAL_COMMUNITY): Payer: Self-pay

## 2018-03-11 ENCOUNTER — Other Ambulatory Visit: Payer: Self-pay

## 2018-03-11 DIAGNOSIS — Y929 Unspecified place or not applicable: Secondary | ICD-10-CM | POA: Insufficient documentation

## 2018-03-11 DIAGNOSIS — S43004A Unspecified dislocation of right shoulder joint, initial encounter: Secondary | ICD-10-CM | POA: Insufficient documentation

## 2018-03-11 DIAGNOSIS — Z79899 Other long term (current) drug therapy: Secondary | ICD-10-CM | POA: Insufficient documentation

## 2018-03-11 DIAGNOSIS — Y939 Activity, unspecified: Secondary | ICD-10-CM | POA: Insufficient documentation

## 2018-03-11 DIAGNOSIS — S43014A Anterior dislocation of right humerus, initial encounter: Secondary | ICD-10-CM

## 2018-03-11 DIAGNOSIS — Y999 Unspecified external cause status: Secondary | ICD-10-CM | POA: Insufficient documentation

## 2018-03-11 DIAGNOSIS — W08XXXA Fall from other furniture, initial encounter: Secondary | ICD-10-CM | POA: Insufficient documentation

## 2018-03-11 DIAGNOSIS — J302 Other seasonal allergic rhinitis: Secondary | ICD-10-CM | POA: Insufficient documentation

## 2018-03-11 MED ORDER — HYDROCODONE-ACETAMINOPHEN 5-325 MG PO TABS
2.0000 | ORAL_TABLET | Freq: Once | ORAL | Status: AC
  Administered 2018-03-11: 2 via ORAL
  Filled 2018-03-11: qty 2

## 2018-03-11 MED ORDER — PROPOFOL 10 MG/ML IV BOLUS
INTRAVENOUS | Status: AC | PRN
  Administered 2018-03-11: 40 mg via INTRAVENOUS
  Administered 2018-03-11 (×5): 20 mg via INTRAVENOUS

## 2018-03-11 MED ORDER — HYDROCODONE-ACETAMINOPHEN 5-325 MG PO TABS: 1.0000 | ORAL_TABLET | Freq: Four times a day (QID) | ORAL | 0 refills | Status: DC | PRN

## 2018-03-11 MED ORDER — PROPOFOL 10 MG/ML IV BOLUS
0.5000 mg/kg | Freq: Once | INTRAVENOUS | Status: AC
  Administered 2018-03-11: 35.2 mg via INTRAVENOUS
  Filled 2018-03-11: qty 20

## 2018-03-11 NOTE — ED Provider Notes (Signed)
[facility] Provider Note   CSN: [id number] Arrival date & time: 03/11/18  0012     History   Chief Complaint Chief Complaint  Patient presents with  . Shoulder Pain    HPI Sydney Wallace is a 27 y.o. female.  Patient is a 27 year old female presenting with complaints of a right shoulder injury.  She was hanging drapes when she fell off a barstool.  She used her hand to break her fall.  She has been unable to move her shoulder since.  She denies any numbness or tingling.  She denies any other injury.     Past Medical History:  Diagnosis Date  . PONV (postoperative nausea and vomiting)   . Seasonal allergies   . Umbilical hernia 12/2016    Patient Active Problem List   Diagnosis Date Noted  . Other specified arthropod-borne viral fevers 04/04/2017    Past Surgical History:  Procedure Laterality Date  . ANTERIOR CRUCIATE LIGAMENT REPAIR Left   . INSERTION OF MESH N/A 01/08/2017   Procedure: INSERTION OF MESH;  Surgeon: Abigail MiyamotoBlackman, Loyalty Arentz, MD;  Location: [facility];  Service: General;  Laterality: N/A;  . UMBILICAL HERNIA REPAIR N/A 01/08/2017   Procedure: UMBILICAL HERNIA REPAIR;  Surgeon: Abigail MiyamotoBlackman, Srihaan Mastrangelo, MD;  Location: [facility];  Service: General;  Laterality: N/A;     OB History   None      Home Medications    Prior to Admission medications   Medication Sig Start Date End Date Taking? Authorizing Provider  desogestrel-ethinyl estradiol (APRI,EMOQUETTE,SOLIA) 0.15-30 MG-MCG tablet Take by mouth. 10/09/15   [provider]  ibuprofen (ADVIL,MOTRIN) 800 MG tablet Take by mouth.    [provider]  ondansetron (ZOFRAN-ODT) 4 MG disintegrating tablet 1 (ONE) TABLET BY MOUTH EVERY SIX HOURS, AS NEEDED FOR NAUSEA 01/12/17   [provider]  oxyCODONE-acetaminophen (PERCOCET/ROXICET) 5-325 MG tablet TAKE 1 TO 2 TABLETS EVERY 4 HOURS AS NEEDED FOR MODERATE OR SEVERE PAIN 01/08/17    [provider]  traMADol (ULTRAM) 50 MG tablet TAKE 1 TO 2 TABLETS EVERY 4 HOURS AS NEEDED FOR PAIN 01/12/17   [provider]    Family History History reviewed. No pertinent family history.  Social History Social History   Tobacco Use  . Smoking status: Never Smoker  . Smokeless tobacco: Never Used  Substance Use Topics  . Alcohol use: Yes    Comment: weekends  . Drug use: No     Allergies   Patient has no known allergies.   Review of Systems Review of Systems  All other systems reviewed and are negative.    Physical Exam Updated Vital Signs BP 126/73 (BP Location: Left Arm)   Pulse 84   Temp 97.8 F (36.6 C)   Resp 18   Ht 5\' 4"  (1.626 m)   Wt 70.3 kg (155 lb)   LMP 03/08/2018   SpO2 96%   BMI 26.61 kg/m   Physical Exam  Constitutional: She appears well-developed and well-nourished. No distress.  HENT:  Head: Normocephalic and atraumatic.  Neck: Normal range of motion. Neck supple.  Cardiovascular: Normal rate and regular rhythm.  No murmur heard. Pulmonary/Chest: Effort normal and breath sounds normal. No respiratory distress.  Musculoskeletal:  The right shoulder as what appears to be a glenohumeral dislocation.  She is able to flex, extend, and oppose all fingers.  Ulnar and radial pulses are easily palpable.  Sensation is intact throughout the entire hand.  Skin: She is not diaphoretic.  Nursing note and vitals reviewed.    ED Treatments / Results  Labs (all labs ordered are listed, but only abnormal results are displayed) Labs Reviewed - No data to display  EKG None  Radiology Dg Shoulder Right  Result Date: 03/11/2018 CLINICAL DATA:  Fall with upper arm pain EXAM: RIGHT SHOULDER - 2+ VIEW COMPARISON:  None. FINDINGS: Anterior inferior dislocation of the right humeral head with respect to the glenoid fossa. No definitive fracture is seen. IMPRESSION: Anterior inferior dislocation of the right humeral head. Electronically  Signed   By: Jasmine Pang M.D.   On: 03/11/2018 00:49   Dg Humerus Right  Result Date: 03/11/2018 CLINICAL DATA:  Fall EXAM: RIGHT HUMERUS - 2+ VIEW COMPARISON:  None. FINDINGS: No acute displaced fracture. Anterior, inferior dislocation of the right humeral head with respect to the glenoid fossa IMPRESSION: Anterior shoulder dislocation.  No fracture. Electronically Signed   By: Jasmine Pang M.D.   On: 03/11/2018 00:49    Procedures .Sedation Date/Time: 03/11/2018 2:44 AM Performed by: Geoffery Lyons, MD Authorized by: Geoffery Lyons, MD   Consent:    Consent obtained:  Verbal   Consent given by:  Patient   Risks discussed:  Allergic reaction, dysrhythmia, inadequate sedation, nausea, prolonged hypoxia resulting in organ damage, prolonged sedation necessitating reversal, respiratory compromise necessitating ventilatory assistance and intubation and vomiting   Alternatives discussed:  Analgesia without sedation, anxiolysis and regional anesthesia Universal protocol:    Procedure explained and questions answered to patient or proxy's satisfaction: yes     Relevant documents present and verified: yes     Test results available and properly labeled: yes     Imaging studies available: yes     Required blood products, implants, devices, and special equipment available: yes     Site/side marked: yes     Immediately prior to procedure a time out was called: yes     Patient identity confirmation method:  Verbally with patient Indications:    Procedure necessitating sedation performed by:  Physician performing sedation   Intended level of sedation:  Deep Pre-sedation assessment:    Time since last food or drink:  3 hours   ASA classification: class 1 - normal, healthy patient     Neck mobility: normal     Mouth opening:  3 or more finger widths   Thyromental distance:  4 finger widths   Mallampati score:  I - soft palate, uvula, fauces, pillars visible   Pre-sedation assessments completed  and reviewed: airway patency, cardiovascular function, hydration status, mental status, nausea/vomiting, pain level, respiratory function and temperature   Immediate pre-procedure details:    Reassessment: Patient reassessed immediately prior to procedure     Reviewed: vital signs, relevant labs/tests and NPO status     Verified: bag valve mask available, emergency equipment available, intubation equipment available, IV patency confirmed, oxygen available and suction available   Procedure details (see MAR for exact dosages):    Preoxygenation:  Nasal cannula   Sedation:  Propofol   Intra-procedure monitoring:  Blood pressure monitoring, cardiac monitor, continuous pulse oximetry, frequent LOC assessments, frequent vital sign checks and continuous capnometry   Intra-procedure events: none     Total Provider sedation time (minutes):  15 Post-procedure details:    Attendance: Constant attendance by certified staff until patient recovered     Recovery: Patient returned to pre-procedure baseline     Post-sedation assessments completed and reviewed: airway patency, cardiovascular function, hydration status,  mental status, nausea/vomiting, pain level, respiratory function and temperature     Patient is stable for discharge or admission: yes     Patient tolerance:  Tolerated well, no immediate complications Reduction of dislocation Date/Time: 03/11/2018 2:44 AM Performed by: Geoffery Lyons, MD Authorized by: Geoffery Lyons, MD  Consent: Verbal consent obtained. Consent given by: patient Patient understanding: patient states understanding of the procedure being performed Patient consent: the patient's understanding of the procedure matches consent given Procedure consent: procedure consent matches procedure scheduled Relevant documents: relevant documents present and verified Test results: test results available and properly labeled Site marked: the operative site was marked Imaging studies: imaging  studies available Patient identity confirmed: verbally with patient, arm band and provided demographic data Time out: Immediately prior to procedure a "time out" was called to verify the correct patient, procedure, equipment, support staff and site/side marked as required. Local anesthesia used: no  Anesthesia: Local anesthesia used: no  Sedation: Patient sedated: yes Sedatives: propofol Sedation start date/time: 03/11/2018 1:45 AM Sedation end date/time: 03/11/2018 2:00 AM Vitals: Vital signs were monitored during sedation.  Patient tolerance: Patient tolerated the procedure well with no immediate complications Comments: Reduction easily performed with traction countertraction.    (including critical care time)  Medications Ordered in ED Medications  propofol (DIPRIVAN) 10 mg/mL bolus/IV push 35.2 mg (has no administration in time range)     Initial Impression / Assessment and Plan / ED Course  I have reviewed the triage vital signs and the nursing notes.  Pertinent labs & imaging results that were available during my care of the patient were reviewed by me and considered in my medical decision making (see chart for details).  Patient sedated using propofol.  Shoulder was easily reduced with traction countertraction.  She is neurovascularly intact pre-and post reduction.  Postreduction films confirm anatomic position.  She will be placed in an arm sling and is to follow-up as needed.  Final Clinical Impressions(s) / ED Diagnoses   Final diagnoses:  None    ED Discharge Orders    None       Geoffery Lyons, MD 03/11/18 [phone]

## 2018-03-11 NOTE — ED Triage Notes (Signed)
Pt BIB [facility] for eval of R shoulder pain. Pt was standing on a 3 ft stool hanging curtains, and fell off landing on R shoulder. Obvious swelling w/ ? Deformity to R shoulder. 100 mcg fentanyl given IN and additional 100 mcg fentanyl given IV. P reports pain is 7/10. +CSM to R hand w/ <3 cap refill

## 2018-03-11 NOTE — Progress Notes (Signed)
Orthopedic Tech Progress Note Patient Details:  Sydney Wallace December 14, 1990 [id number]  Ortho Devices Type of Ortho Device: Sling immobilizer Ortho Device/Splint Location: rue. applied post reduction at drs request. Ortho Device/Splint Interventions: Ordered, Application   Post Interventions Patient Tolerated: Well Instructions Provided: Care of device, Adjustment of device   Trinna PostMartinez, Grethel Zenk J 03/11/2018, 2:30 AM

## 2018-03-11 NOTE — Discharge Instructions (Addendum)
Wear arm sling for the next several days, then gradually reintroduce activity.  Ice for 20 minutes every 2 hours for the next 2 days while awake.  Hydrocodone as prescribed as needed for pain.

## 2019-11-02 ENCOUNTER — Other Ambulatory Visit: Payer: Self-pay

## 2019-11-02 ENCOUNTER — Encounter (HOSPITAL_BASED_OUTPATIENT_CLINIC_OR_DEPARTMENT_OTHER): Payer: Self-pay | Admitting: Orthopedic Surgery

## 2019-11-04 ENCOUNTER — Other Ambulatory Visit (HOSPITAL_COMMUNITY)
Admission: RE | Admit: 2019-11-04 | Discharge: 2019-11-04 | Disposition: A | Discharge status: Home / Self Care | Payer: Managed Care, Other (non HMO) | Source: Ambulatory Visit | Attending: Orthopedic Surgery | Admitting: Orthopedic Surgery

## 2019-11-04 ENCOUNTER — Encounter (HOSPITAL_BASED_OUTPATIENT_CLINIC_OR_DEPARTMENT_OTHER)
Admission: RE | Admit: 2019-11-04 | Discharge: 2019-11-04 | Disposition: A | Discharge status: Home / Self Care | Payer: Managed Care, Other (non HMO) | Source: Ambulatory Visit | Attending: Orthopedic Surgery | Admitting: Orthopedic Surgery

## 2019-11-04 DIAGNOSIS — X58XXXA Exposure to other specified factors, initial encounter: Secondary | ICD-10-CM | POA: Diagnosis not present

## 2019-11-04 DIAGNOSIS — Z20822 Contact with and (suspected) exposure to covid-19: Secondary | ICD-10-CM | POA: Insufficient documentation

## 2019-11-04 DIAGNOSIS — S86011A Strain of right Achilles tendon, initial encounter: Secondary | ICD-10-CM | POA: Diagnosis not present

## 2019-11-04 DIAGNOSIS — Z01812 Encounter for preprocedural laboratory examination: Secondary | ICD-10-CM | POA: Insufficient documentation

## 2019-11-04 LAB — CBC
HCT: 39.6 % (ref 36.0–46.0)
Hemoglobin: 13.3 g/dL (ref 12.0–15.0)
MCH: 29 pg (ref 26.0–34.0)
MCHC: 33.6 g/dL (ref 30.0–36.0)
MCV: 86.5 fL (ref 80.0–100.0)
Platelets: 229 10*3/uL (ref 150–400)
RBC: 4.58 MIL/uL (ref 3.87–5.11)
RDW: 12.6 % (ref 11.5–15.5)
WBC: 2 10*3/uL — ABNORMAL LOW (ref 4.0–10.5)
nRBC: 0 % (ref 0.0–0.2)

## 2019-11-04 LAB — BASIC METABOLIC PANEL
Anion gap: 9 (ref 5–15)
BUN: 12 mg/dL (ref 6–20)
CO2: 23 mmol/L (ref 22–32)
Calcium: 9.5 mg/dL (ref 8.9–10.3)
Chloride: 107 mmol/L (ref 98–111)
Creatinine, Ser: 0.69 mg/dL (ref 0.44–1.00)
GFR calc Af Amer: >60 mL/min (ref >60)
GFR calc non Af Amer: >60 mL/min (ref >60)
Glucose, Bld: 95 mg/dL (ref 70–99)
Potassium: 4.6 mmol/L (ref 3.5–5.1)
Sodium: 139 mmol/L (ref 135–145)

## 2019-11-04 LAB — SARS CORONAVIRUS 2 (TAT 6-24 HRS): SARS Coronavirus 2: NEGATIVE

## 2019-11-04 NOTE — Progress Notes (Signed)

## 2019-11-08 ENCOUNTER — Encounter (HOSPITAL_BASED_OUTPATIENT_CLINIC_OR_DEPARTMENT_OTHER)
Admission: RE | Disposition: A | Discharge status: Home / Self Care | Payer: Self-pay | Source: Home / Self Care | Attending: Orthopedic Surgery

## 2019-11-08 ENCOUNTER — Ambulatory Visit (HOSPITAL_BASED_OUTPATIENT_CLINIC_OR_DEPARTMENT_OTHER)
Admission: RE | Admit: 2019-11-08 | Discharge: 2019-11-08 | Disposition: A | Discharge status: Home / Self Care | Payer: Managed Care, Other (non HMO) | Attending: Orthopedic Surgery | Admitting: Orthopedic Surgery

## 2019-11-08 ENCOUNTER — Ambulatory Visit (HOSPITAL_BASED_OUTPATIENT_CLINIC_OR_DEPARTMENT_OTHER): Payer: Managed Care, Other (non HMO) | Admitting: Anesthesiology

## 2019-11-08 ENCOUNTER — Encounter (HOSPITAL_BASED_OUTPATIENT_CLINIC_OR_DEPARTMENT_OTHER): Payer: Self-pay | Admitting: Orthopedic Surgery

## 2019-11-08 ENCOUNTER — Other Ambulatory Visit: Payer: Self-pay

## 2019-11-08 DIAGNOSIS — X58XXXA Exposure to other specified factors, initial encounter: Secondary | ICD-10-CM | POA: Insufficient documentation

## 2019-11-08 DIAGNOSIS — S86011A Strain of right Achilles tendon, initial encounter: Secondary | ICD-10-CM | POA: Insufficient documentation

## 2019-11-08 HISTORY — PX: ACHILLES TENDON SURGERY: SHX542

## 2019-11-08 LAB — POCT PREGNANCY, URINE: Preg Test, Ur: NEGATIVE

## 2019-11-08 SURGERY — REPAIR, TENDON, ACHILLES
Anesthesia: General | Site: Ankle | Laterality: Right

## 2019-11-08 MED ORDER — ACETAMINOPHEN 500 MG PO TABS
1000.0000 mg | ORAL_TABLET | Freq: Once | ORAL | Status: AC
  Administered 2019-11-08: 10:00:00 1000 mg via ORAL

## 2019-11-08 MED ORDER — FENTANYL CITRATE (PF) 100 MCG/2ML IJ SOLN
INTRAMUSCULAR | Status: AC
  Filled 2019-11-08: qty 2

## 2019-11-08 MED ORDER — LACTATED RINGERS IV SOLN: INTRAVENOUS | Status: DC

## 2019-11-08 MED ORDER — CEFAZOLIN SODIUM-DEXTROSE 2-4 GM/100ML-% IV SOLN
INTRAVENOUS | Status: AC
  Filled 2019-11-08: qty 100

## 2019-11-08 MED ORDER — ACETAMINOPHEN 500 MG PO TABS
ORAL_TABLET | ORAL | Status: AC
  Filled 2019-11-08: qty 2

## 2019-11-08 MED ORDER — FENTANYL CITRATE (PF) 100 MCG/2ML IJ SOLN
INTRAMUSCULAR | Status: DC | PRN
  Administered 2019-11-08 (×2): 50 ug via INTRAVENOUS

## 2019-11-08 MED ORDER — ONDANSETRON HCL 4 MG/2ML IJ SOLN
INTRAMUSCULAR | Status: DC | PRN
  Administered 2019-11-08: 4 mg via INTRAVENOUS

## 2019-11-08 MED ORDER — OXYCODONE HCL 5 MG PO TABS
5.0000 mg | ORAL_TABLET | Freq: Once | ORAL | Status: AC
  Administered 2019-11-08: 5 mg via ORAL

## 2019-11-08 MED ORDER — ONDANSETRON HCL 4 MG PO TABS: 4.0000 mg | ORAL_TABLET | Freq: Three times a day (TID) | ORAL | 0 refills | Status: DC | PRN

## 2019-11-08 MED ORDER — LIDOCAINE HCL (CARDIAC) PF 100 MG/5ML IV SOSY
PREFILLED_SYRINGE | INTRAVENOUS | Status: DC | PRN
  Administered 2019-11-08: 60 mg via INTRAVENOUS

## 2019-11-08 MED ORDER — SUGAMMADEX SODIUM 200 MG/2ML IV SOLN
INTRAVENOUS | Status: DC | PRN
  Administered 2019-11-08: 200 mg via INTRAVENOUS

## 2019-11-08 MED ORDER — OXYCODONE HCL 5 MG PO TABS
ORAL_TABLET | ORAL | Status: AC
  Filled 2019-11-08: qty 1

## 2019-11-08 MED ORDER — EPHEDRINE SULFATE 50 MG/ML IJ SOLN
INTRAMUSCULAR | Status: DC | PRN
  Administered 2019-11-08: 10 mg via INTRAVENOUS

## 2019-11-08 MED ORDER — SENNA-DOCUSATE SODIUM 8.6-50 MG PO TABS: 2.0000 | ORAL_TABLET | Freq: Every day | ORAL | 1 refills | Status: DC

## 2019-11-08 MED ORDER — CEFAZOLIN SODIUM-DEXTROSE 2-4 GM/100ML-% IV SOLN
2.0000 g | INTRAVENOUS | Status: AC
  Administered 2019-11-08: 11:00:00 2 g via INTRAVENOUS

## 2019-11-08 MED ORDER — OXYCODONE HCL 5 MG PO TABS: 5.0000 mg | ORAL_TABLET | ORAL | 0 refills | Status: DC | PRN

## 2019-11-08 MED ORDER — ROCURONIUM BROMIDE 100 MG/10ML IV SOLN
INTRAVENOUS | Status: DC | PRN
  Administered 2019-11-08: 50 mg via INTRAVENOUS

## 2019-11-08 MED ORDER — DEXAMETHASONE SODIUM PHOSPHATE 4 MG/ML IJ SOLN
INTRAMUSCULAR | Status: DC | PRN
  Administered 2019-11-08: 5 mg via INTRAVENOUS

## 2019-11-08 MED ORDER — MIDAZOLAM HCL 2 MG/2ML IJ SOLN
1.0000 mg | INTRAMUSCULAR | Status: DC | PRN
  Administered 2019-11-08: 10:00:00 2 mg via INTRAVENOUS

## 2019-11-08 MED ORDER — CHLORHEXIDINE GLUCONATE 4 % EX LIQD: 60.0000 mL | Freq: Once | CUTANEOUS | Status: DC

## 2019-11-08 MED ORDER — HYDROMORPHONE HCL 1 MG/ML IJ SOLN: 0.2500 mg | INTRAMUSCULAR | Status: DC | PRN

## 2019-11-08 MED ORDER — PROPOFOL 10 MG/ML IV BOLUS
INTRAVENOUS | Status: DC | PRN
  Administered 2019-11-08: 200 mg via INTRAVENOUS

## 2019-11-08 MED ORDER — MIDAZOLAM HCL 2 MG/2ML IJ SOLN
INTRAMUSCULAR | Status: AC
  Filled 2019-11-08: qty 2

## 2019-11-08 MED ORDER — FENTANYL CITRATE (PF) 100 MCG/2ML IJ SOLN
50.0000 ug | INTRAMUSCULAR | Status: DC | PRN
  Administered 2019-11-08: 100 ug via INTRAVENOUS

## 2019-11-08 SURGICAL SUPPLY — 65 items
BANDAGE ESMARK 6X9 LF (GAUZE/BANDAGES/DRESSINGS) ×1 IMPLANT
BLADE SURG 15 STRL LF DISP TIS (BLADE) ×3 IMPLANT
BLADE SURG 15 STRL SS (BLADE) ×6
BNDG COHESIVE 4X5 TAN STRL (GAUZE/BANDAGES/DRESSINGS) ×3 IMPLANT
BNDG ELASTIC 4X5.8 VLCR STR LF (GAUZE/BANDAGES/DRESSINGS) ×3 IMPLANT
BNDG ELASTIC 6X5.8 VLCR STR LF (GAUZE/BANDAGES/DRESSINGS) ×3 IMPLANT
BNDG ESMARK 6X9 LF (GAUZE/BANDAGES/DRESSINGS) ×3
CANISTER SUCT 1200ML W/VALVE (MISCELLANEOUS) ×3 IMPLANT
CLOSURE STERI-STRIP 1/2X4 (GAUZE/BANDAGES/DRESSINGS) ×1
CLSR STERI-STRIP ANTIMIC 1/2X4 (GAUZE/BANDAGES/DRESSINGS) ×1 IMPLANT
COVER BACK TABLE 60X90IN (DRAPES) ×3 IMPLANT
COVER WAND RF STERILE (DRAPES) IMPLANT
CUFF TOURN SGL QUICK 34 (TOURNIQUET CUFF) ×2
CUFF TRNQT CYL 34X4.125X (TOURNIQUET CUFF) IMPLANT
DECANTER SPIKE VIAL GLASS SM (MISCELLANEOUS) IMPLANT
DRAPE EXTREMITY T 121X128X90 (DISPOSABLE) ×3 IMPLANT
DRAPE IMP U-DRAPE 54X76 (DRAPES) ×3 IMPLANT
DRAPE U-SHAPE 47X51 STRL (DRAPES) ×3 IMPLANT
DRSG PAD ABDOMINAL 8X10 ST (GAUZE/BANDAGES/DRESSINGS) ×3 IMPLANT
DURAPREP 26ML APPLICATOR (WOUND CARE) ×3 IMPLANT
ELECT REM PT RETURN 9FT ADLT (ELECTROSURGICAL) ×3
ELECTRODE REM PT RTRN 9FT ADLT (ELECTROSURGICAL) ×1 IMPLANT
GAUZE SPONGE 4X4 12PLY STRL (GAUZE/BANDAGES/DRESSINGS) ×3 IMPLANT
GAUZE XEROFORM 1X8 LF (GAUZE/BANDAGES/DRESSINGS) ×2 IMPLANT
GLOVE BIO SURGEON STRL SZ 6.5 (GLOVE) ×1 IMPLANT
GLOVE BIO SURGEON STRL SZ7 (GLOVE) ×3 IMPLANT
GLOVE BIO SURGEONS STRL SZ 6.5 (GLOVE) ×1
GLOVE BIOGEL PI IND STRL 7.0 (GLOVE) ×1 IMPLANT
GLOVE BIOGEL PI IND STRL 8 (GLOVE) ×2 IMPLANT
GLOVE BIOGEL PI INDICATOR 7.0 (GLOVE) ×6
GLOVE BIOGEL PI INDICATOR 8 (GLOVE) ×4
GLOVE ORTHO TXT STRL SZ7.5 (GLOVE) ×3 IMPLANT
GLOVE SURG ORTHO 8.0 STRL STRW (GLOVE) ×3 IMPLANT
GOWN STRL REUS W/ TWL LRG LVL3 (GOWN DISPOSABLE) ×1 IMPLANT
GOWN STRL REUS W/ TWL XL LVL3 (GOWN DISPOSABLE) ×2 IMPLANT
GOWN STRL REUS W/TWL LRG LVL3 (GOWN DISPOSABLE) ×4
GOWN STRL REUS W/TWL XL LVL3 (GOWN DISPOSABLE) ×2
NDL HYPO 25X1 1.5 SAFETY (NEEDLE) IMPLANT
NEEDLE HYPO 25X1 1.5 SAFETY (NEEDLE) IMPLANT
NS IRRIG 1000ML POUR BTL (IV SOLUTION) ×3 IMPLANT
PACK BASIN DAY SURGERY FS (CUSTOM PROCEDURE TRAY) ×3 IMPLANT
PAD CAST 4YDX4 CTTN HI CHSV (CAST SUPPLIES) ×2 IMPLANT
PADDING CAST COTTON 4X4 STRL (CAST SUPPLIES) ×4
PENCIL SMOKE EVACUATOR (MISCELLANEOUS) ×3 IMPLANT
SLEEVE SCD COMPRESS KNEE MED (MISCELLANEOUS) ×4 IMPLANT
SPLINT FAST PLASTER 5X30 (CAST SUPPLIES) ×20
SPLINT PLASTER CAST FAST 5X30 (CAST SUPPLIES) IMPLANT
SPONGE LAP 4X18 RFD (DISPOSABLE) ×3 IMPLANT
STOCKINETTE 6  STRL (DRAPES) ×2
STOCKINETTE 6 STRL (DRAPES) ×1 IMPLANT
SUCTION FRAZIER HANDLE 10FR (MISCELLANEOUS) ×2
SUCTION TUBE FRAZIER 10FR DISP (MISCELLANEOUS) ×1 IMPLANT
SUT ETHILON 3 0 PS 1 (SUTURE) ×4 IMPLANT
SUT FIBERWIRE #2 38 T-5 BLUE (SUTURE) ×12
SUT MNCRL AB 4-0 PS2 18 (SUTURE) IMPLANT
SUT VIC AB 2-0 SH 27 (SUTURE) ×2
SUT VIC AB 2-0 SH 27XBRD (SUTURE) IMPLANT
SUT VICRYL 3-0 CR8 SH (SUTURE) ×3 IMPLANT
SUTURE FIBERWR #2 38 T-5 BLUE (SUTURE) IMPLANT
SYR BULB 3OZ (MISCELLANEOUS) ×3 IMPLANT
SYR CONTROL 10ML LL (SYRINGE) IMPLANT
TUBE CONNECTING 20'X1/4 (TUBING) ×1
TUBE CONNECTING 20X1/4 (TUBING) ×1 IMPLANT
UNDERPAD 30X36 HEAVY ABSORB (UNDERPADS AND DIAPERS) ×3 IMPLANT
YANKAUER SUCT BULB TIP NO VENT (SUCTIONS) IMPLANT

## 2019-11-08 NOTE — Anesthesia Postprocedure Evaluation (Signed)
Anesthesia Post Note  Patient: Sydney Wallace  Procedure(s) Performed: ACHILLES TENDON REPAIR (Right Ankle)     Patient location during evaluation: PACU Anesthesia Type: General Level of consciousness: awake Pain management: pain level controlled Vital Signs Assessment: post-procedure vital signs reviewed and stable Respiratory status: spontaneous breathing Cardiovascular status: stable Postop Assessment: no apparent nausea or vomiting Anesthetic complications: no    Last Vitals:  Vitals:   11/08/19 1251 11/08/19 1317  BP:  137/90  Pulse: 92 86  Resp: 14 16  Temp:  36.5 C  SpO2: 100% 100%    Last Pain:  Vitals:   11/08/19 1324  TempSrc:   PainSc: 4                  Ewing Fandino

## 2019-11-08 NOTE — Anesthesia Procedure Notes (Signed)
Anesthesia Procedure Note     

## 2019-11-08 NOTE — Discharge Instructions (Signed)
Diet: As you were doing prior to hospitalization   Shower:  May shower but keep the splint dry, use an occlusive plastic wrap, NO SOAKING IN TUB. Leave the splint in place and keep the splint dry with a plastic bag.  Dressing:  You may change your dressing 3-5 days after surgery, unless you have a splint.  If you have a splint, then just leave the splint in place and we will change your bandages during your first follow-up appointment.    We will plan to remove your stitches in about 2 weeks in the office.    Activity:  Increase activity slowly as tolerated, but follow the weight bearing instructions below.  The rules on driving is that you can not be taking narcotics while you drive, and you must feel in control of the vehicle.    Weight Bearing:  Do not bear weight on right leg.   To prevent constipation: you may use a stool softener such as -  Colace (over the counter) 100 mg by mouth twice a day  Drink plenty of fluids (prune juice may be helpful) and high fiber foods Miralax (over the counter) for constipation as needed.    Itching:  If you experience itching with your medications, try taking only a single pain pill, or even half a pain pill at a time.  You may take up to 10 pain pills per day, and you can also use benadryl over the counter for itching or also to help with sleep.   Precautions:  If you experience chest pain or shortness of breath - call 911 immediately for transfer to the hospital emergency department!!  If you develop a fever greater that 101 F, purulent drainage from wound, increased redness or drainage from wound, or calf pain -- Call the office at [phone]                                                Follow- Up Appointment:  Please call for an appointment to be seen in 2 weeks [place] - [phone]    NO TYLENOL PRODUCTS UNTIL 3:30 PM    Post Anesthesia Home Care Instructions  Activity: Get plenty of rest for the remainder of the day. A  responsible individual must stay with you for 24 hours following the procedure.  For the next 24 hours, DO NOT: -Drive a car -Paediatric nurse -Drink alcoholic beverages -Take any medication unless instructed by your physician -Make any legal decisions or sign important papers.  Meals: Start with liquid foods such as gelatin or soup. Progress to regular foods as tolerated. Avoid greasy, spicy, heavy foods. If nausea and/or vomiting occur, drink only clear liquids until the nausea and/or vomiting subsides. Call your physician if vomiting continues.  Special Instructions/Symptoms: Your throat may feel dry or sore from the anesthesia or the breathing tube placed in your throat during surgery. If this causes discomfort, gargle with warm salt water. The discomfort should disappear within 24 hours.  If you had a scopolamine patch placed behind your ear for the management of post- operative nausea and/or vomiting:  1. The medication in the patch is effective for 72 hours, after which it should be removed.  Wrap patch in a tissue and discard in the trash. Wash hands thoroughly with soap and water. 2. You may remove the patch earlier than 72 hours  if you experience unpleasant side effects which may include dry mouth, dizziness or visual disturbances. 3. Avoid touching the patch. Wash your hands with soap and water after contact with the patch.     Regional Anesthesia Blocks  1. Numbness or the inability to move the "blocked" extremity may last from 3-48 hours after placement. The length of time depends on the medication injected and your individual response to the medication. If the numbness is not going away after 48 hours, call your surgeon.  2. The extremity that is blocked will need to be protected until the numbness is gone and the  Strength has returned. Because you cannot feel it, you will need to take extra care to avoid injury. Because it may be weak, you may have difficulty moving it or  using it. You may not know what position it is in without looking at it while the block is in effect.  3. For blocks in the legs and feet, returning to weight bearing and walking needs to be done carefully. You will need to wait until the numbness is entirely gone and the strength has returned. You should be able to move your leg and foot normally before you try and bear weight or walk. You will need someone to be with you when you first try to ensure you do not fall and possibly risk injury.  4. Bruising and tenderness at the needle site are common side effects and will resolve in a few days.  5. Persistent numbness or new problems with movement should be communicated to the surgeon or the [facility] [phone] [facility] [phone]).

## 2019-11-08 NOTE — Anesthesia Procedure Notes (Addendum)
Anesthesia Regional Block: Popliteal block   Pre-Anesthetic Checklist: ,, timeout performed, Correct Patient, Correct Site, Correct Laterality, Correct Procedure, Correct Position, site marked, Risks and benefits discussed,  Surgical consent,  Pre-op evaluation,  At surgeon's request and post-op pain management  Laterality: Right  Prep: chloraprep       Needles:  Injection technique: Single-shot      Additional Needles:   Procedures: Doppler guided,,,, ultrasound used (permanent image in chart),,,,  Narrative:  Start time: 11/08/2019 10:00 AM End time: 11/08/2019 10:15 AM Injection made incrementally with aspirations every 5 mL.  Performed by: Personally  Anesthesiologist: Dorris Singh, MD

## 2019-11-08 NOTE — Anesthesia Preprocedure Evaluation (Signed)
Anesthesia Evaluation  Patient identified by MRN, date of birth, ID band Patient awake    Reviewed: Allergy & Precautions, NPO status , Patient's Chart, lab work & pertinent test results  History of Anesthesia Complications (+) PONV  Airway Mallampati: II  TM Distance: >3 FB     Dental   Pulmonary    breath sounds clear to auscultation       Cardiovascular negative cardio ROS   Rhythm:Regular Rate:Normal     Neuro/Psych    GI/Hepatic negative GI ROS, Neg liver ROS,   Endo/Other  negative endocrine ROS  Renal/GU negative Renal ROS     Musculoskeletal   Abdominal   Peds  Hematology   Anesthesia Other Findings   Reproductive/Obstetrics                             Anesthesia Physical Anesthesia Plan  ASA: I  Anesthesia Plan: General   Post-op Pain Management:  Regional for Post-op pain   Induction: Intravenous  PONV Risk Score and Plan: 4 or greater and Ondansetron, Dexamethasone and Midazolam  Airway Management Planned: LMA  Additional Equipment:   Intra-op Plan:   Post-operative Plan: Extubation in OR  Informed Consent: I have reviewed the patients History and Physical, chart, labs and discussed the procedure including the risks, benefits and alternatives for the proposed anesthesia with the patient or authorized representative who has indicated his/her understanding and acceptance.     Dental advisory given  Plan Discussed with: CRNA and Anesthesiologist  Anesthesia Plan Comments:         Anesthesia Quick Evaluation

## 2019-11-08 NOTE — H&P (Signed)
PREOPERATIVE H&P  Chief Complaint: Right Achilles tear  HPI: Sydney Wallace is a 29 y.o. female who presents for preoperative history and physical with a diagnosis of right Achilles tear that occurred October 30, 2019 after she was playing basketball, felt a pop posteriorly, acute onset weakness, difficulty ambulation.. Symptoms are rated as moderate to severe, and have been worsening.  This is significantly impairing activities of daily living.  She has elected for surgical management.  She works as a Geophysicist/field seismologist for [employer].  Past Medical History:  Diagnosis Date  . PONV (postoperative nausea and vomiting)   . Seasonal allergies   . Umbilical hernia 59/4585   Past Surgical History:  Procedure Laterality Date  . ANTERIOR CRUCIATE LIGAMENT REPAIR Left   . INSERTION OF MESH N/A 01/08/2017   Procedure: INSERTION OF MESH;  Surgeon: Coralie Keens, MD;  Location: [place];  Service: General;  Laterality: N/A;  . UMBILICAL HERNIA REPAIR N/A 01/08/2017   Procedure: [place];  Surgeon: Coralie Keens, MD;  Location: [place];  Service: General;  Laterality: N/A;   Social History   Socioeconomic History  . Marital status: Single    Spouse name: Not on file  . Number of children: Not on file  . Years of education: Not on file  . Highest education level: Not on file  Occupational History  . Not on file  Tobacco Use  . Smoking status: Never Smoker  . Smokeless tobacco: Never Used  Substance and Sexual Activity  . Alcohol use: Yes    Comment: weekends  . Drug use: No  . Sexual activity: Yes    Birth control/protection: None  Other Topics Concern  . Not on file  Social History Narrative  . Not on file   Social Determinants of Health   Financial Resource Strain:   . Difficulty of Paying Living Expenses: Not on file  Food Insecurity:   . Worried About Charity fundraiser in the Last Year: Not on file  . Ran Out of Food in the Last  Year: Not on file  Transportation Needs:   . Lack of Transportation (Medical): Not on file  . Lack of Transportation (Non-Medical): Not on file  Physical Activity:   . Days of Exercise per Week: Not on file  . Minutes of Exercise per Session: Not on file  Stress:   . Feeling of Stress : Not on file  Social Connections:   . Frequency of Communication with Friends and Family: Not on file  . Frequency of Social Gatherings with Friends and Family: Not on file  . Attends Religious Services: Not on file  . Active Member of Clubs or Organizations: Not on file  . Attends Archivist Meetings: Not on file  . Marital Status: Not on file   History reviewed. No pertinent family history. No Known Allergies Prior to Admission medications   Medication Sig Start Date End Date Taking? Authorizing Provider  ibuprofen (ADVIL,MOTRIN) 800 MG tablet Take by mouth.   Yes [provider]     Positive ROS: All other systems have been reviewed and were otherwise negative with the exception of those mentioned in the HPI and as above.  Physical Exam: General: Alert, no acute distress Cardiovascular: No pedal edema Respiratory: No cyanosis, no use of accessory musculature GI: No organomegaly, abdomen is soft and non-tender Skin: No lesions in the area of chief complaint Neurologic: Sensation intact distally Psychiatric: Patient is competent for consent with normal  mood and affect Lymphatic: No axillary or cervical lymphadenopathy  MUSCULOSKELETAL: Right Achilles has positive ecchymosis mild soft tissue swelling absent plantarflexion with palpation of the gastroc.  Palpable gap noted.  Assessment: Right Achilles rupture   Plan: Plan for Procedure(s): ACHILLES TENDON REPAIR  The risks benefits and alternatives were discussed with the patient including but not limited to the risks of nonoperative treatment, versus surgical intervention including infection, bleeding, nerve injury,   blood clots, cardiopulmonary complications, morbidity, mortality, among others, and they were willing to proceed.  We have also discussed the risks of weakness, recurrent rupture, wound healing complications, among others.   Eulas Post, MD Cell [phone]   11/08/2019 10:20 AM

## 2019-11-08 NOTE — Transfer of Care (Signed)
Immediate Anesthesia Transfer of Care Note  Patient: Sydney Wallace  Procedure(s) Performed: ACHILLES TENDON REPAIR (Right Ankle)  Patient Location: PACU  Anesthesia Type:General  Level of Consciousness: sedated  Airway & Oxygen Therapy: Patient Spontanous Breathing and Patient connected to nasal cannula oxygen  Post-op Assessment: Report given to RN and Post -op Vital signs reviewed and stable  Post vital signs: Reviewed and stable  Last Vitals:  Vitals Value Taken Time  BP    Temp    Pulse 117 11/08/19 1218  Resp 14 11/08/19 1218  SpO2 100 % 11/08/19 1218  Vitals shown include unvalidated device data.  Last Pain:  Vitals:   11/08/19 0930  TempSrc: Oral  PainSc: 4       Patients Stated Pain Goal: 4 (11/08/19 0930)  Complications: No apparent anesthesia complications

## 2019-11-08 NOTE — Anesthesia Procedure Notes (Signed)
Procedure Name: Intubation Date/Time: 11/08/2019 10:45 AM Performed by: Maryella Shivers, CRNA Pre-anesthesia Checklist: Patient identified, Emergency Drugs available, Suction available and Patient being monitored Patient Re-evaluated:Patient Re-evaluated prior to induction Oxygen Delivery Method: Circle system utilized Preoxygenation: Pre-oxygenation with 100% oxygen Induction Type: IV induction Ventilation: Mask ventilation without difficulty Laryngoscope Size: Mac and 3 Grade View: Grade I Tube type: Oral Tube size: 7.0 mm Number of attempts: 1 Airway Equipment and Method: Stylet and Oral airway Placement Confirmation: ETT inserted through vocal cords under direct vision,  positive ETCO2 and breath sounds checked- equal and bilateral Secured at: 20 cm Tube secured with: Tape Dental Injury: Teeth and Oropharynx as per pre-operative assessment

## 2019-11-08 NOTE — Op Note (Signed)
11/08/2019  12:02 PM  PATIENT:  Sydney Wallace    PRE-OPERATIVE DIAGNOSIS:  right Achilles Rupture  POST-OPERATIVE DIAGNOSIS:  Same  PROCEDURE:  right Achilles tendon repair  SURGEON:  Teryl Lucy, MD  PHYSICIAN ASSISTANT: Janine Ores, PA-C, present and scrubbed throughout the case, critical for completion in a timely fashion, and for retraction, instrumentation, and closure.  ANESTHESIA:   General  UNIQUE ASPECTS OF THE CASE: The proximal tissue was somewhat weak, and the stitch tended to want to pull-through, but ultimately I was able to get excellent hold.  There was a complete 100% transverse disruption of the Achilles tendon with retraction approximately 3 to 5 cm.  ESTIMATED BLOOD LOSS: Minimal  PREOPERATIVE INDICATIONS:  Sydney Wallace is a  29 y.o. female with a diagnosis of achilles tendon rupture who elected for surgical management.    The risks benefits and alternatives were discussed with the patient preoperatively including but not limited to the option of closed treatment, the risks of infection, bleeding, nerve injury, cardiopulmonary complications, the need for revision surgery, repeat rupture, stiffness, skin breakdown with wound healing complications, among others, and the patient was willing to proceed.   OPERATIVE IMPLANTS: #2 FiberWire x 4, with 4 crossing strands   OPERATIVE FINDINGS: The Achilles tendon was completely torn. She had an abnormal Thompson test.  OPERATIVE PROCEDURE: The patient was brought to the operating room and placed in supine position. Antibiotics were given. General anesthesia was administered. The patient was turned prone and the lower extremity was prepped and draped in the usual sterile fashion. The leg was elevated and exsanguinated and the tourniquet was inflated. A posterior medial incision was performed, taking care to protect the sural nerve. The epitendinous tissue was dissected away using sharp dissection in order to minimize soft  tissue trauma. The peritenon was incised sharply and then the superficial layer dissected away. The tendon was shredded, and I cleaned the tendinous edges, and prepared them for repair.  I placed a total of 2 #2 FiberWire in a running interrupted Krakw type fashion up and down the tendon proximally on the posterior and anterior aspect, and then performed parallel repair on the distal segment of tendon. I then tied the anterior lateral proximal sutures to the anterior lateral distal suture, and then the anterior medial proximal suture to the anterior medial distal suture. I then tied the corresponding posterior sutures together. Excellent tendinous apposition was achieved.   I irrigated the wounds copiously.  The subcutaneous tissues closed with Vicryl with routine closure using 3-0 nylon for the skin with Steri-Strips and sterile gauze for the skin. A posterior splint was applied with the foot in slight plantarflexion. The tourniquet was released.  The patient was awakened and returned to the PACU in stable and satisfactory condition. There no complications and She tolerated the procedure well.

## 2019-11-08 NOTE — Progress Notes (Signed)
Assisted Dr. Kimm Sider with right, ultrasound guided, popliteal block. Side rails up, monitors on throughout procedure. See vital signs in flow sheet. Tolerated Procedure well. 

## 2019-11-16 NOTE — Addendum Note (Signed)
Addendum  created 11/16/19 2127 by Dorris Singh, MD   Clinical Note Signed, Intraprocedure Blocks edited

## 2019-12-22 ENCOUNTER — Ambulatory Visit (INDEPENDENT_AMBULATORY_CARE_PROVIDER_SITE_OTHER): Payer: Managed Care, Other (non HMO) | Admitting: Orthopedic Surgery

## 2019-12-22 ENCOUNTER — Encounter: Payer: Self-pay | Admitting: Orthopedic Surgery

## 2019-12-22 ENCOUNTER — Other Ambulatory Visit: Payer: Self-pay

## 2019-12-22 DIAGNOSIS — Z189 Retained foreign body fragments, unspecified material: Secondary | ICD-10-CM | POA: Diagnosis not present

## 2019-12-22 DIAGNOSIS — T8189XA Other complications of procedures, not elsewhere classified, initial encounter: Secondary | ICD-10-CM | POA: Diagnosis not present

## 2019-12-22 DIAGNOSIS — L97411 Non-pressure chronic ulcer of right heel and midfoot limited to breakdown of skin: Secondary | ICD-10-CM

## 2019-12-22 NOTE — Progress Notes (Signed)
Office Visit Note   Patient: Sydney Wallace           Date of Birth: 1991-01-23           MRN: [id number] Visit Date: 12/22/2019              Requested by: [facility] [place] [address] [place],  [place] [address] PCP: [facility] [place]  Chief Complaint  Patient presents with  . Right Ankle - Pain      HPI: Patient is a 29 year old woman who was seen in initial evaluation in referral from Dr. Dion Saucier.  Patient is 6 weeks status post right Achilles tendon reconstruction.  She has had some wound breakdown and complications.  Patient is currently on doxycycline.  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  Assessment & Plan: Visit Diagnoses:  1. 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 Retained suture, initial encounter     Plan: The deep retained suture was removed Iodosorb and a dry dressing was applied.  Patient will wash this daily apply an antibiotic ointment to gauze rewrapped with an Ace wrap continue with light duty work reevaluate in 1 week.  Follow-Up Instructions: Return in about 1 week (around 12/29/2019).   Ortho Exam  Patient is alert, oriented, no adenopathy, well-dressed, normal affect, normal respiratory effort. Examination patient has a strong dorsalis pedis and posterior tibial pulse she has some hyper granulation tissue over the Achilles tendon incision with exposed braided nonabsorbable suture.  The wound is about 10 mm in diameter and has proud hyper granulation tissue.  There is no surrounding cellulitis.  After informed consent the wound was painted with Betadine paint.  The suture was grasped with a needle driver and a large knot was removed with approximately 2 cm of the suture on either end.  This was cut with a 15 blade knife.  Iodosorb and a dry dressing was applied.  No ischemic changes there is good bleeding.   Imaging: No results found. No images are attached to the encounter.  Labs: No results found for: HGBA1C, ESRSEDRATE, CRP, LABURIC, REPTSTATUS, GRAMSTAIN, CULT, LABORGA   Lab Results  Component Value Date   ALBUMIN 4.1 04/07/2017   ALBUMIN 3.8 04/06/2017   ALBUMIN 3.7 04/05/2017    No results found for: MG No results found for: VD25OH  No results found for: PREALBUMIN CBC EXTENDED Latest Ref Rng & Units 11/04/2019 04/05/2017 04/04/2017  WBC 4.0 - 10.5 K/uL 2.0(L) 3.0(L) 4.2  RBC 3.87 - 5.11 MIL/uL 4.58 4.15 4.57  HGB 12.0 - 15.0 g/dL 94.8 54.6 27.0  HCT 35.0 - 46.0 % 39.6 36.2 40.7  PLT 150 - 400 K/uL 229 144(L) 150     There is no height or weight on file to calculate BMI.  Orders:  No orders of the defined types were placed in this encounter.  No orders of the defined types were placed in this encounter.    Procedures: No procedures performed  Clinical Data: No additional findings.  ROS:  All other systems negative, except as noted in the HPI. Review of Systems  Objective: Vital Signs: There were no vitals taken for this visit.  Specialty Comments:  No specialty comments available.  PMFS History: Patient Active Problem List   Diagnosis Date Noted  . 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 PONV (postoperative nausea and vomiting)   . Seasonal allergies   . Umbilical hernia 94/8016    History reviewed. No pertinent family history.  Past Surgical History:  Procedure Laterality Date  . ACHILLES TENDON SURGERY Right 11/08/2019   Procedure: ACHILLES TENDON REPAIR;  Surgeon: Marchia Bond, MD;  Location: [place];  Service: Orthopedics;  Laterality: Right;  . ANTERIOR CRUCIATE LIGAMENT REPAIR Left   . INSERTION OF MESH N/A 01/08/2017   Procedure: INSERTION OF MESH;  Surgeon: Coralie Keens, MD;  Location: [place];  Service: General;  Laterality: N/A;  .  UMBILICAL HERNIA REPAIR N/A 01/08/2017   Procedure: [place];  Surgeon: Coralie Keens, MD;  Location: [place];  Service: General;  Laterality: N/A;   Social History   Occupational History  . Not on file  Tobacco Use  . Smoking status: Never Smoker  . Smokeless tobacco: Never Used  Substance and Sexual Activity  . Alcohol use: Yes    Comment: weekends  . Drug use: No  . Sexual activity: Yes    Birth control/protection: None

## 2019-12-29 ENCOUNTER — Ambulatory Visit (INDEPENDENT_AMBULATORY_CARE_PROVIDER_SITE_OTHER): Payer: Managed Care, Other (non HMO) | Admitting: Orthopedic Surgery

## 2019-12-29 ENCOUNTER — Other Ambulatory Visit: Payer: Self-pay

## 2019-12-29 ENCOUNTER — Encounter: Payer: Self-pay | Admitting: Orthopedic Surgery

## 2019-12-29 VITALS — Ht 64.0 in | Wt 165.1 lb

## 2019-12-29 DIAGNOSIS — L97411 Non-pressure chronic ulcer of right heel and midfoot limited to breakdown of skin: Secondary | ICD-10-CM | POA: Diagnosis not present

## 2019-12-29 NOTE — Progress Notes (Signed)
Office Visit Note   Patient: Sydney Wallace           Date of Birth: April 21, 1991           MRN: [id number] Visit Date: 12/29/2019              Requested by: [facility] [address] [place],  [place] [address] PCP: [facility]  Chief Complaint  Patient presents with  . Right Ankle - Routine Post Op    11/08/19 right achilles tendon repair      HPI: Patient is a 29 year old woman who was seen in follow-up for ulcer right Achilles status post Achilles tendon reconstruction on November 08, 2019.  On her last office visit a deep retrained nonabsorbable monofilament suture was removed patient is currently using antibiotic ointment she states the wound appears to be closing at this time.  Assessment & Plan: Visit Diagnoses:  1. 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  Patient request return to work and patient is given a note to return to work.  Follow-Up Instructions: Return in about 2 weeks (around 01/12/2020).   Ortho Exam  Patient is alert, oriented, no adenopathy, well-dressed, normal affect, normal respiratory effort. Examination the wound appears to be healing well there is no exposed tendon bone or suture there is no cellulitis no drainage no odor no signs of infection.  The wound is 5 mm in diameter 10 mm in length and 0.1 mm deep.  Imaging: No results found.   Labs: No results found for: HGBA1C, ESRSEDRATE, CRP, LABURIC, REPTSTATUS, GRAMSTAIN, CULT, LABORGA   Lab Results  Component Value Date   ALBUMIN 4.1 04/07/2017   ALBUMIN 3.8 04/06/2017   ALBUMIN 3.7 04/05/2017    No results found for: MG No results found for: VD25OH  No results found for: PREALBUMIN CBC EXTENDED Latest Ref Rng & Units 11/04/2019 04/05/2017 04/04/2017  WBC 4.0 - 10.5 K/uL  2.0(L) 3.0(L) 4.2  RBC 3.87 - 5.11 MIL/uL 4.58 4.15 4.57  HGB 12.0 - 15.0 g/dL 62.1 30.8 65.7  HCT 84.6 - 46.0 % 39.6 36.2 40.7  PLT 150 - 400 K/uL 229 144(L) 150     Body mass index is 28.34 kg/m.  Orders:  No orders of the defined types were placed in this encounter.  No orders of the defined types were placed in this encounter.    Procedures: No procedures performed  Clinical Data: No additional findings.  ROS:  All other systems negative, except as noted in the HPI. Review of Systems  Objective: Vital Signs: Ht 5\' 4"  (1.626 m)   Wt 165 lb 1.9 oz (74.9 kg)   BMI 28.34 kg/m   Specialty Comments:  No specialty comments available.  PMFS History: Patient Active Problem List   Diagnosis Date Noted  . 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 PONV (postoperative nausea and vomiting)   . Seasonal allergies   . Umbilical hernia 12/2016    History reviewed. No pertinent family history.  Past Surgical History:  Procedure Laterality Date  . ACHILLES TENDON SURGERY Right 11/08/2019   Procedure: ACHILLES TENDON REPAIR;  Surgeon: 01/08/2020, MD;  Location: [facility];  Service: Orthopedics;  Laterality: Right;  . ANTERIOR CRUCIATE LIGAMENT  REPAIR Left   . INSERTION OF MESH N/A 01/08/2017   Procedure: INSERTION OF MESH;  Surgeon: Coralie Keens, MD;  Location: [facility];  Service: General;  Laterality: N/A;  . UMBILICAL HERNIA REPAIR N/A 01/08/2017   Procedure: [place];  Surgeon: Coralie Keens, MD;  Location: [facility];  Service: General;  Laterality: N/A;   Social History   Occupational History  . Not on file  Tobacco Use  . Smoking status: Never Smoker  . Smokeless tobacco: Never Used  Substance and Sexual Activity  . Alcohol use: Yes    Comment: weekends  . Drug use: No  . Sexual activity: Yes    Birth control/protection: None

## 2020-01-09 IMAGING — DX DG HUMERUS 2V *R*
3 series · 3 of 3 positions shown · non-contrast
Comparison: None.

CLINICAL DATA: Fall

EXAM:
RIGHT HUMERUS - 2+ VIEW

[humerus ap (1 of 2)]
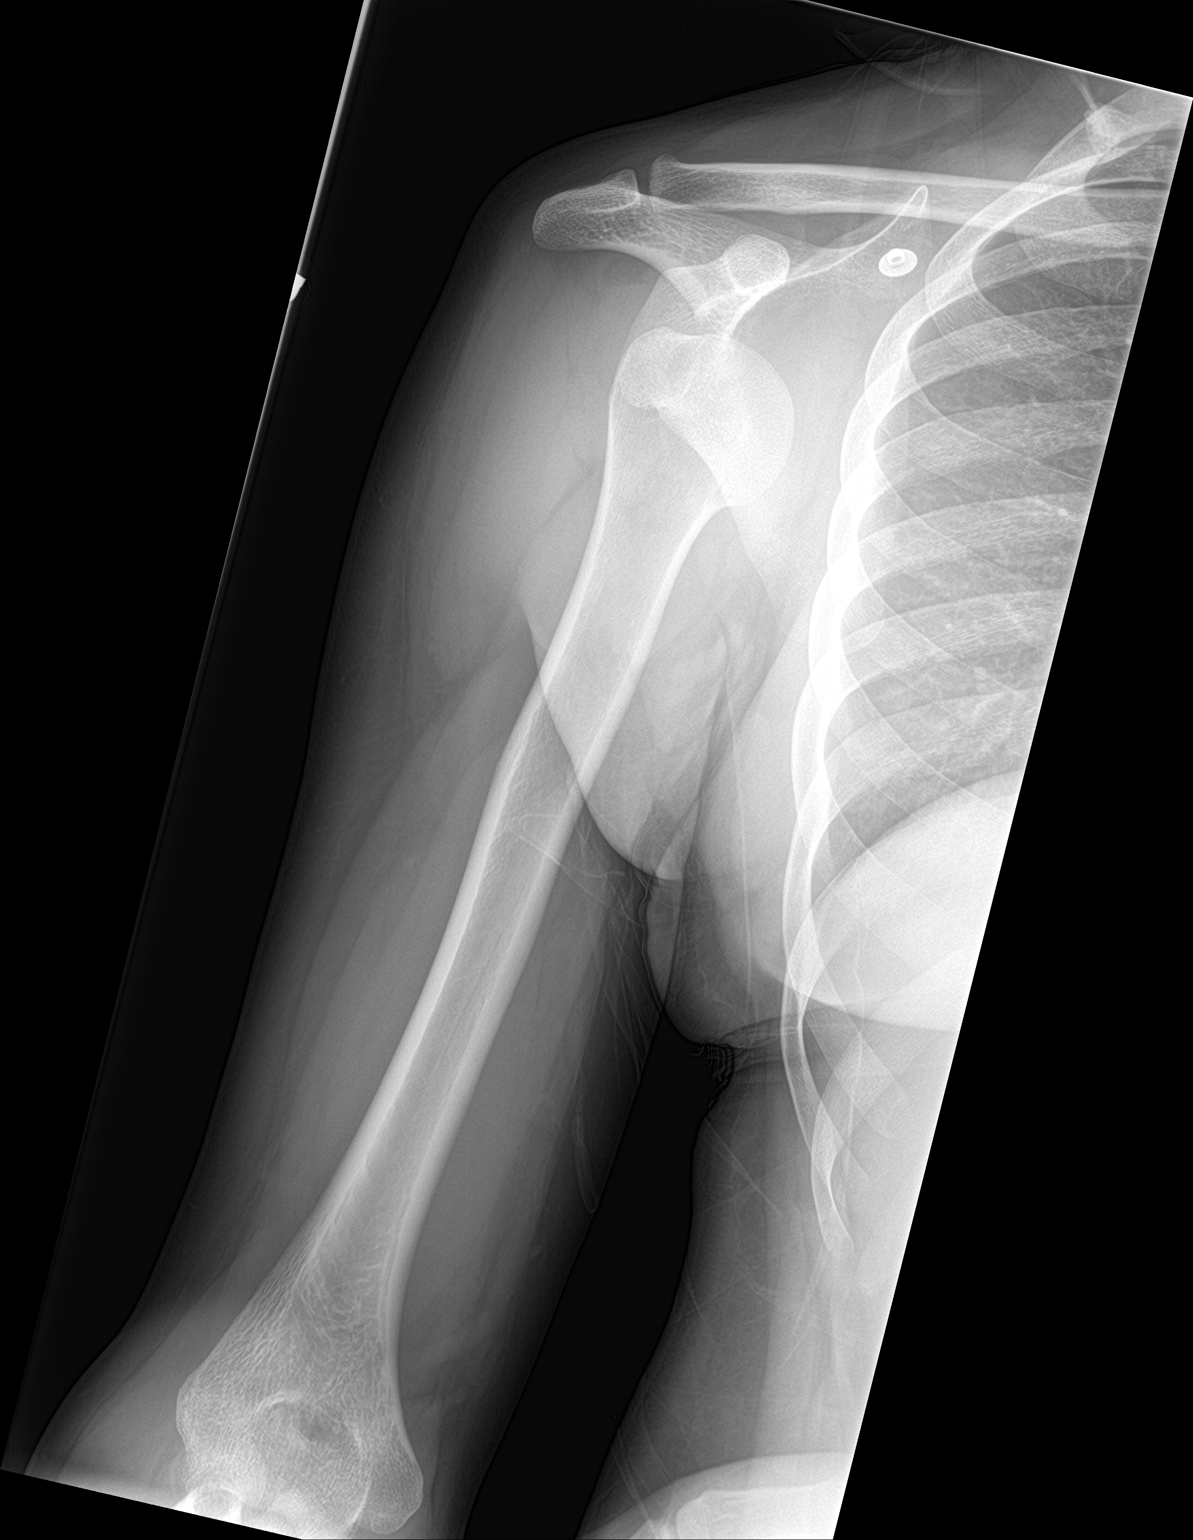

[humerus lat]
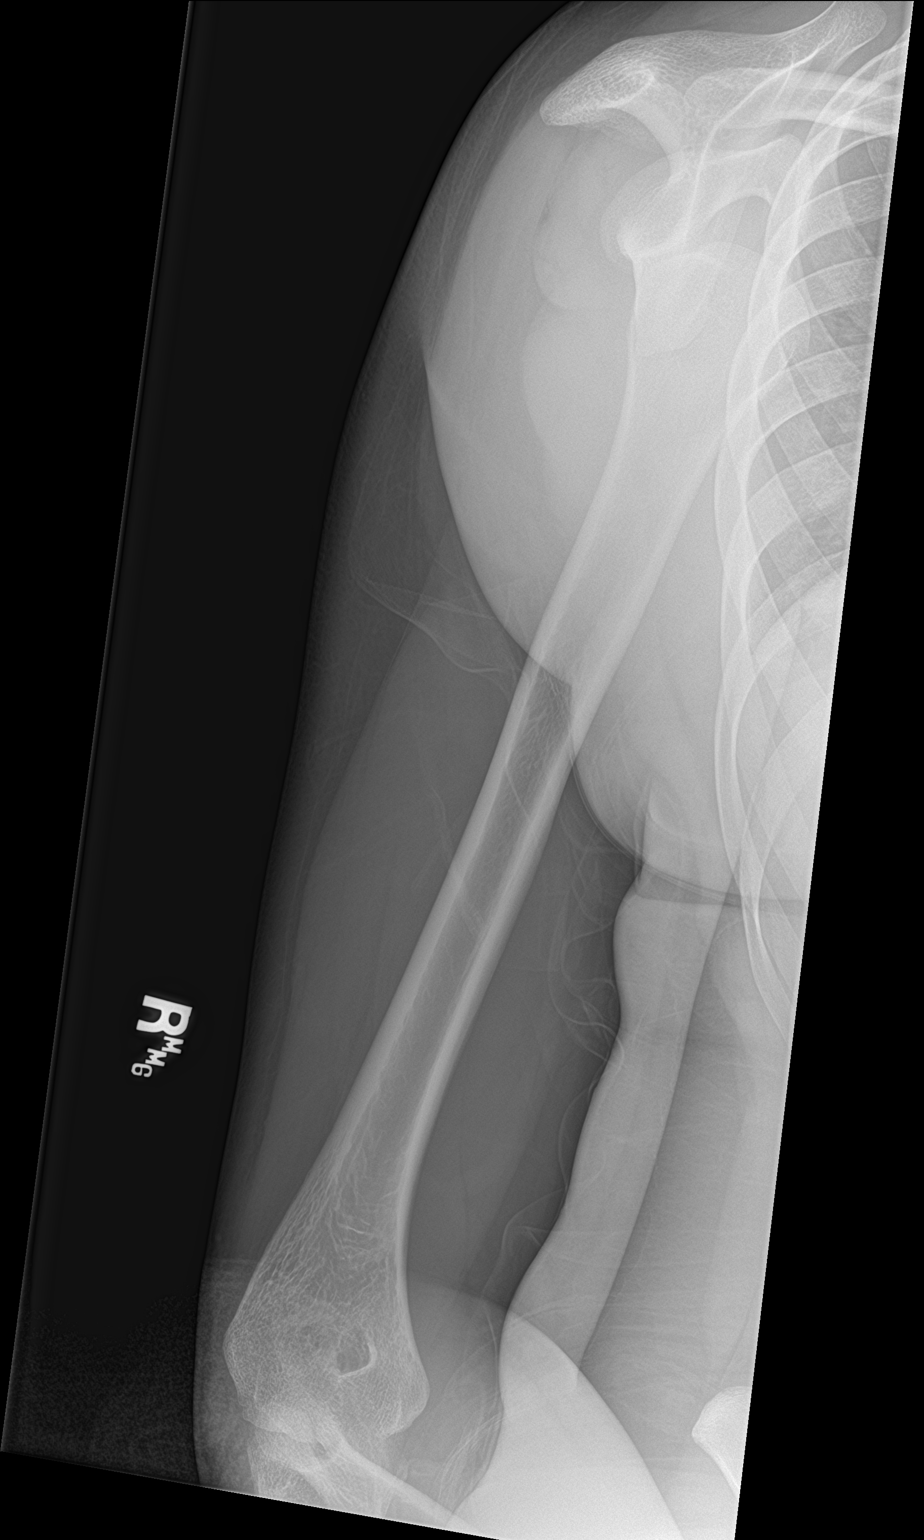

[humerus ap (2 of 2)]
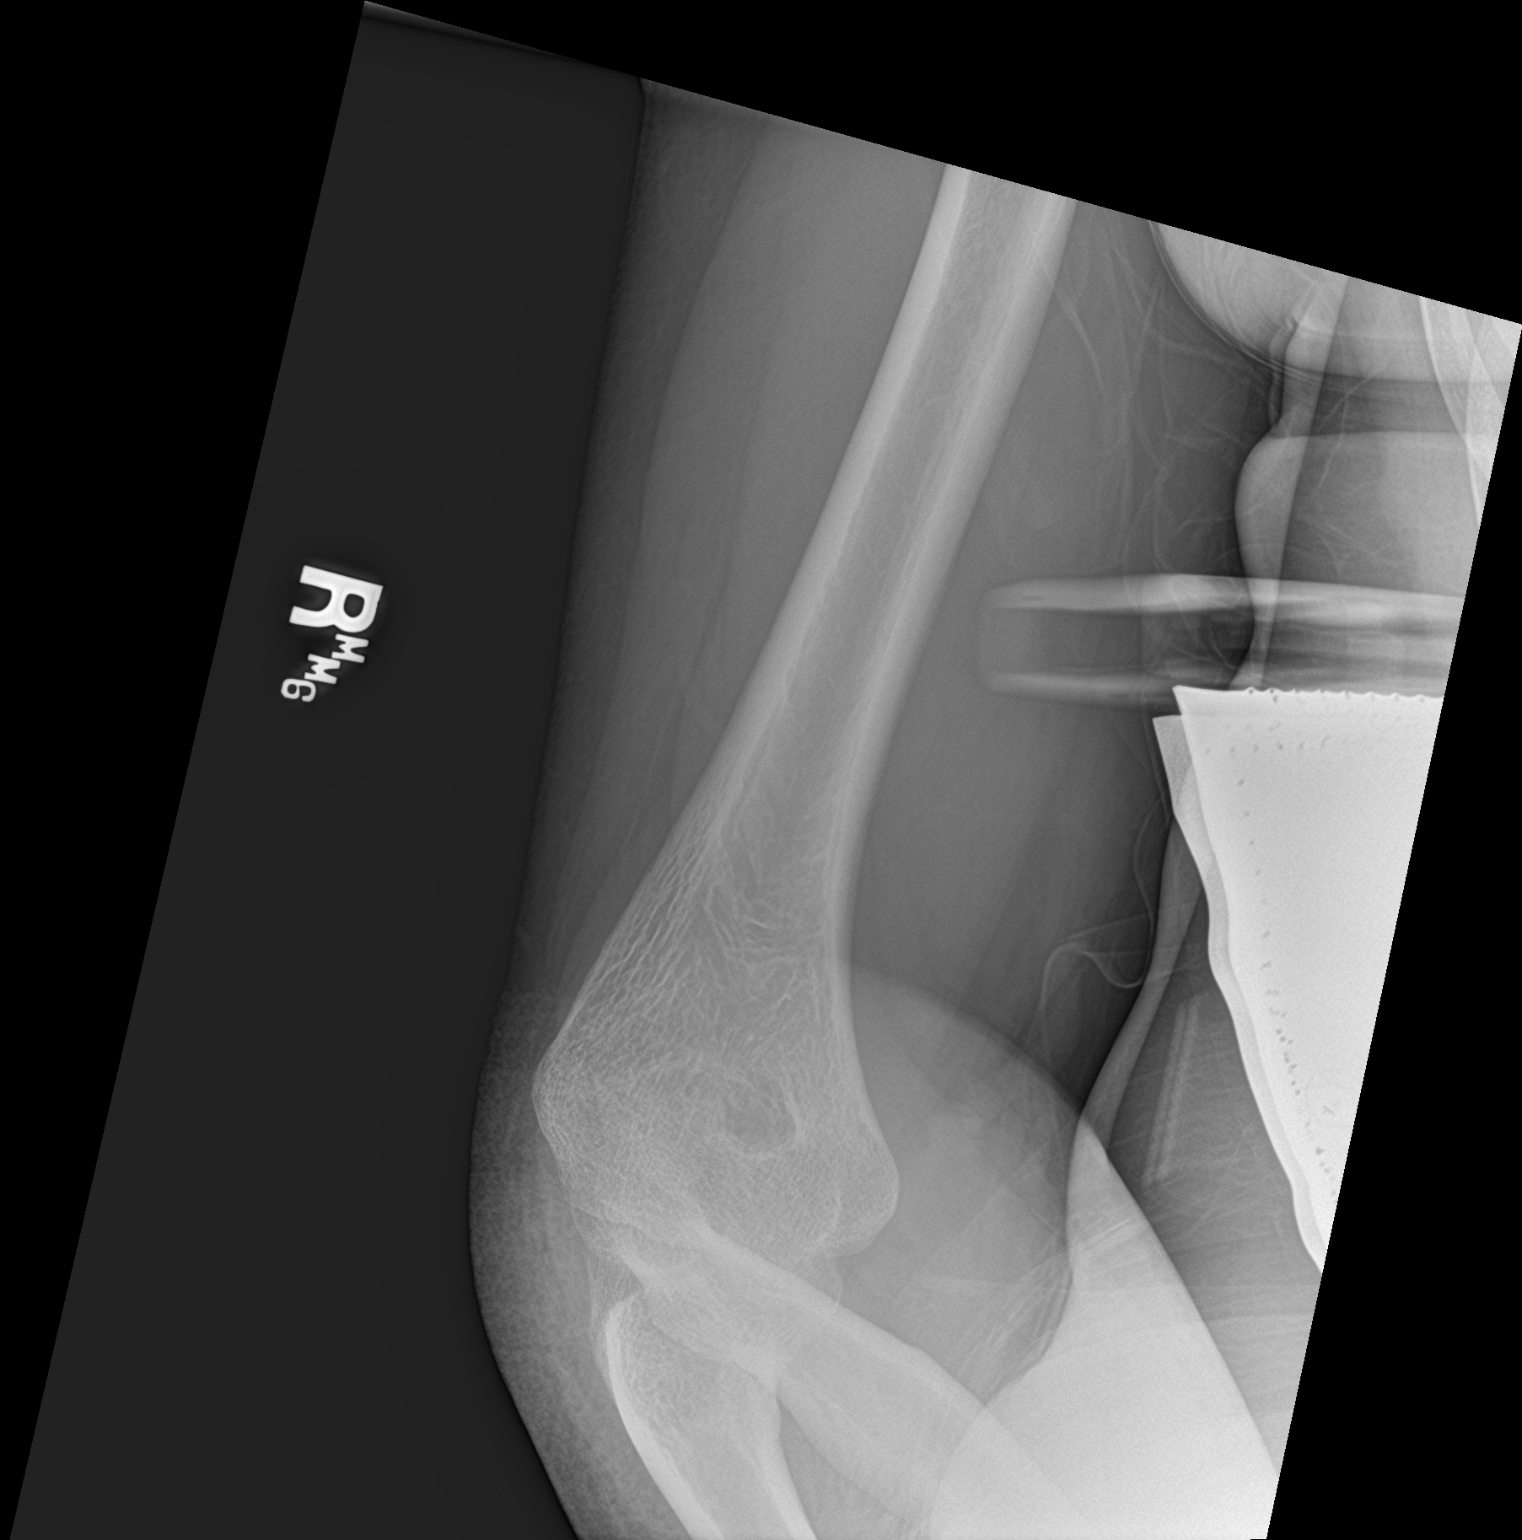

[3 of 3 positions shown; findings below may reference images not displayed]

FINDINGS: No acute displaced fracture. Anterior, inferior dislocation of the
right humeral head with respect to the glenoid fossa
IMPRESSION: Anterior shoulder dislocation.  No fracture.

## 2020-01-12 ENCOUNTER — Other Ambulatory Visit: Payer: Self-pay

## 2020-01-12 ENCOUNTER — Ambulatory Visit (INDEPENDENT_AMBULATORY_CARE_PROVIDER_SITE_OTHER): Payer: Managed Care, Other (non HMO) | Admitting: Orthopedic Surgery

## 2020-01-12 ENCOUNTER — Encounter: Payer: Self-pay | Admitting: Orthopedic Surgery

## 2020-01-12 DIAGNOSIS — L97411 Non-pressure chronic ulcer of right heel and midfoot limited to breakdown of skin: Secondary | ICD-10-CM | POA: Diagnosis not present

## 2020-01-12 NOTE — Progress Notes (Signed)
Office Visit Note   Patient: Sydney Wallace           Date of Birth: 08/12/1991           MRN: [id number] Visit Date: 01/12/2020              Requested by: [facility] [place],  [place] [address] PCP: [facility] Complaint  Patient presents with  . Right Foot - Follow-up      HPI: Patient is seen in follow-up for ulceration status post right Achilles tendon reconstruction.  She is currently wearing regular shoewear she denies any drainage at this time she is not wearing her compression socks.  Assessment & Plan: Visit Diagnoses:  1. Non-pressure chronic ulcer of right heel and midfoot limited to breakdown of skin ([facility])     Plan: Recommended continue with the compression socks recommended nonimpact strengthening such as bicycling with a high rpm.  Continue increase her activities as tolerated.  Follow-Up Instructions: Return if symptoms worsen or fail to improve.   Ortho Exam  Patient is alert, oriented, no adenopathy, well-dressed, normal affect, normal respiratory effort. Examination the ulcer has completely healed there is no redness no cellulitis no drainage no signs of infection.  There is a very superficial scab that is about 5 mm in diameter over the wound.  Imaging: No results found. No images are attached to the encounter.  Labs: No results found for: HGBA1C, ESRSEDRATE, CRP, LABURIC, REPTSTATUS, GRAMSTAIN, CULT, LABORGA   Lab Results  Component Value Date   ALBUMIN 4.1 04/07/2017   ALBUMIN 3.8 04/06/2017   ALBUMIN 3.7 04/05/2017    No results found for: MG No results found for: VD25OH  No results found for: PREALBUMIN CBC EXTENDED Latest Ref Rng & Units 11/04/2019 04/05/2017 04/04/2017  WBC 4.0 - 10.5 K/uL 2.0(L) 3.0(L) 4.2  RBC 3.87 - 5.11 MIL/uL 4.58 4.15 4.57  HGB 12.0 - 15.0 g/dL 13.3 12.3 13.8  HCT 36.0 - 46.0 % 39.6 36.2 40.7  PLT 150 - 400 K/uL 229 144(L) 150      There is no height or weight on file to calculate BMI.  Orders:  No orders of the defined types were placed in this encounter.  No orders of the defined types were placed in this encounter.    Procedures: No procedures performed  Clinical Data: No additional findings.  ROS:  All other systems negative, except as noted in the HPI. Review of Systems  Objective: Vital Signs: There were no vitals taken for this visit.  Specialty Comments:  No specialty comments available.  PMFS History: Patient Active Problem List   Diagnosis Date Noted  . 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 PONV (postoperative nausea and vomiting)   . Seasonal allergies   . Umbilical hernia [id number]/9381    History reviewed. No pertinent family history.  Past Surgical History:  Procedure Laterality Date  . ACHILLES TENDON SURGERY Right 11/08/2019   Procedure: ACHILLES TENDON REPAIR;  Surgeon: Marchia Bond, MD;  Location: [facility];  Service: Orthopedics;  Laterality: Right;  . ANTERIOR CRUCIATE LIGAMENT REPAIR Left   . INSERTION OF MESH N/A 01/08/2017   Procedure: INSERTION OF MESH;  Surgeon: Coralie Keens, MD;  Location: [facility];  Service: General;  Laterality: N/A;  . UMBILICAL HERNIA REPAIR N/A 01/08/2017   Procedure: UMBILICAL HERNIA REPAIR;  Surgeon:  Abigail Miyamoto, MD;  Location: [facility];  Service: General;  Laterality: N/A;   Social History   Occupational History  . Not on file  Tobacco Use  . Smoking status: Never Smoker  . Smokeless tobacco: Never Used  Substance and Sexual Activity  . Alcohol use: Yes    Comment: weekends  . Drug use: No  . Sexual activity: Yes    Birth control/protection: None

## 2020-01-21 ENCOUNTER — Ambulatory Visit: Payer: Managed Care, Other (non HMO) | Attending: Internal Medicine

## 2020-01-21 DIAGNOSIS — Z23 Encounter for immunization: Secondary | ICD-10-CM

## 2020-01-21 NOTE — Progress Notes (Signed)
   Covid-19 Vaccination Clinic  Name:  Sydney Wallace    MRN: [id number] DOB: Feb 16, 1991  01/21/2020  Ms. Stansell was observed post Covid-19 immunization for 15 minutes without incident. She was provided with Vaccine Information Sheet and instruction to access the V-Safe system.   Ms. Wanek was instructed to call 911 with any severe reactions post vaccine: Marland Kitchen Difficulty breathing  . Swelling of face and throat  . A fast heartbeat  . A bad rash all over body  . Dizziness and weakness   Immunizations Administered    Name Date Dose VIS Date Route   Pfizer COVID-19 Vaccine 01/21/2020 11:39 AM 0.3 mL 11/02/2018 Intramuscular   Manufacturer: [employer], [employer]   Lot: [id number]   NDC: [address]-5

## 2020-02-13 ENCOUNTER — Ambulatory Visit: Payer: Managed Care, Other (non HMO)

## 2020-02-25 ENCOUNTER — Ambulatory Visit: Payer: Managed Care, Other (non HMO) | Attending: Internal Medicine

## 2020-02-25 DIAGNOSIS — Z23 Encounter for immunization: Secondary | ICD-10-CM

## 2020-02-25 NOTE — Progress Notes (Signed)
   Covid-19 Vaccination Clinic  Name:  Sydney Wallace    MRN: [id number] DOB: [birth year]  02/25/2020  Ms. Goldwasser was observed post Covid-19 immunization for 15 minutes without incident. She was provided with Vaccine Information Sheet and instruction to access the V-Safe system.   Ms. Sakamoto was instructed to call 911 with any severe reactions post vaccine: Marland Kitchen Difficulty breathing  . Swelling of face and throat  . A fast heartbeat  . A bad rash all over body  . Dizziness and weakness   Immunizations Administered    Name Date Dose VIS Date Route   Pfizer COVID-19 Vaccine 02/25/2020  9:02 AM 0.3 mL 11/02/2018 Intramuscular   Manufacturer: [employer], [employer]   Lot: [id number]   NDC: [address]-9

## 2020-03-15 ENCOUNTER — Ambulatory Visit: Payer: Managed Care, Other (non HMO) | Admitting: Orthopedic Surgery

## 2020-03-22 ENCOUNTER — Ambulatory Visit (INDEPENDENT_AMBULATORY_CARE_PROVIDER_SITE_OTHER): Payer: Managed Care, Other (non HMO) | Admitting: Physician Assistant

## 2020-03-22 ENCOUNTER — Other Ambulatory Visit: Payer: Self-pay

## 2020-03-22 ENCOUNTER — Encounter: Payer: Self-pay | Admitting: Orthopedic Surgery

## 2020-03-22 DIAGNOSIS — Z189 Retained foreign body fragments, unspecified material: Secondary | ICD-10-CM | POA: Diagnosis not present

## 2020-03-22 DIAGNOSIS — T8189XA Other complications of procedures, not elsewhere classified, initial encounter: Secondary | ICD-10-CM | POA: Diagnosis not present

## 2020-03-22 NOTE — Progress Notes (Signed)
Office Visit Note   Patient: Sydney Wallace           Date of Birth: [birth year]           MRN: [id number] Visit Date: 03/22/2020              Requested by: [facility] [address] [place],  [place] [address] PCP: [facility]  Chief Complaint  Patient presents with  . Right Ankle - Pain      HPI: This is a pleasant active 29 year old woman that we are following up for ulceration status post Achilles tendon reconstruction in March by Dr. Dion Saucier.  She was on a follow-up as needed.  She saw Dr. Dion Saucier 2 weeks ago overall he thought he she was doing well.  He did pull out 2 more retained sutures as she was having some increased pain and erythema he did place her on a 2-week course of antibiotics.  She admits she did not pick up this prescription overall she has much less pain and she states it is less pink  Assessment & Plan: Visit Diagnoses: No diagnosis found.  Plan: I would like her to go on antibiotics for 2 weeks and reevaluate with Dr. Lajoyce Corners.  I emphasized the importance of her picking up and taking her medication continue covering with some antibiotic.  She should follow-up sooner if she has any concerns or redevelops any redness or increasing pain  Follow-Up Instructions: No follow-ups on file.   Ortho Exam  Patient is alert, oriented, no adenopathy, well-dressed, normal affect, normal respiratory effort. Focused examination of her Achilles no cellulitis.  She can plantarflex without pain dorsiflex without pain.  She is minimally tender to palpation over the area.  She does have an eschar.  I did debride some of this and got some bloody slightly purulent tinged fluid.  She did not want me to continue to do this.  I did not appreciate any more sutures  Imaging: No results found. No images are attached to the encounter.  Labs: No results found for: HGBA1C, ESRSEDRATE, CRP, LABURIC, REPTSTATUS, GRAMSTAIN, CULT,  LABORGA   Lab Results  Component Value Date   ALBUMIN 4.1 04/07/2017   ALBUMIN 3.8 04/06/2017   ALBUMIN 3.7 04/05/2017    No results found for: MG No results found for: VD25OH  No results found for: PREALBUMIN CBC EXTENDED Latest Ref Rng & Units 11/04/2019 04/05/2017 04/04/2017  WBC 4.0 - 10.5 K/uL 2.0(L) 3.0(L) 4.2  RBC 3.87 - 5.11 MIL/uL 4.58 4.15 4.57  HGB 12.0 - 15.0 g/dL 70.9 62.8 36.6  HCT 36 - 46 % 39.6 36.2 40.7  PLT 150 - 400 K/uL 229 144(L) 150     There is no height or weight on file to calculate BMI.  Orders:  No orders of the defined types were placed in this encounter.  No orders of the defined types were placed in this encounter.    Procedures: No procedures performed  Clinical Data: No additional findings.  ROS:  All other systems negative, except as noted in the HPI. Review of Systems  Objective: Vital Signs: There were no vitals taken for this visit.  Specialty Comments:  No specialty comments available.  PMFS History: Patient Active Problem List   Diagnosis Date Noted  . 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 PONV (postoperative nausea and vomiting)   . Seasonal allergies   .  Umbilical hernia 12/2016    History reviewed. No pertinent family history.  Past Surgical History:  Procedure Laterality Date  . ACHILLES TENDON SURGERY Right 11/08/2019   Procedure: ACHILLES TENDON REPAIR;  Surgeon: Teryl Lucy, MD;  Location: [facility];  Service: Orthopedics;  Laterality: Right;  . ANTERIOR CRUCIATE LIGAMENT REPAIR Left   . INSERTION OF MESH N/A 01/08/2017   Procedure: INSERTION OF MESH;  Surgeon: Abigail Miyamoto, MD;  Location: [facility];  Service: General;  Laterality: N/A;  . UMBILICAL HERNIA REPAIR N/A 01/08/2017   Procedure: UMBILICAL HERNIA REPAIR;  Surgeon: Abigail Miyamoto, MD;  Location: [facility];  Service: General;   Laterality: N/A;   Social History   Occupational History  . Not on file  Tobacco Use  . Smoking status: Never Smoker  . Smokeless tobacco: Never Used  Vaping Use  . Vaping Use: Never used  Substance and Sexual Activity  . Alcohol use: Yes    Comment: weekends  . Drug use: No  . Sexual activity: Yes    Birth control/protection: None

## 2020-04-05 ENCOUNTER — Ambulatory Visit (INDEPENDENT_AMBULATORY_CARE_PROVIDER_SITE_OTHER): Payer: Managed Care, Other (non HMO) | Admitting: Orthopedic Surgery

## 2020-04-05 ENCOUNTER — Encounter: Payer: Self-pay | Admitting: Orthopedic Surgery

## 2020-04-05 VITALS — Ht 64.0 in | Wt 165.0 lb

## 2020-04-05 DIAGNOSIS — T8189XA Other complications of procedures, not elsewhere classified, initial encounter: Secondary | ICD-10-CM

## 2020-04-05 DIAGNOSIS — Z189 Retained foreign body fragments, unspecified material: Secondary | ICD-10-CM | POA: Diagnosis not present

## 2020-04-05 DIAGNOSIS — L97411 Non-pressure chronic ulcer of right heel and midfoot limited to breakdown of skin: Secondary | ICD-10-CM

## 2020-04-05 NOTE — Progress Notes (Signed)
Office Visit Note   Patient: Sydney Wallace           Date of Birth: 05-16-1991           MRN: [id number] Visit Date: 04/05/2020              Requested by: [facility] [address] [place],  [place] [address] PCP: [facility]  Chief Complaint  Patient presents with  . Right Achilles Tendon - Follow-up    Achilles tendon recon with Dr. Dion Saucier 11/2019      HPI: Patient is a 29 year old woman who was seen in follow-up for open ulcer right Achilles tendon status post tendon reconstruction in March.  She has been seen several times me to remove some suture and the wound has healed some but there is still persistent swelling open wound and drainage.   Assessment & Plan: Visit Diagnoses:  1. 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 Retained suture, initial encounter     Plan: With the persistent ulceration and drainage have recommended at this time that we would need to proceed with formal surgical debridement of the Achilles tendon removal of all retained suture wound closure and placement of a axial form wound VAC dressing.  Discussed with the patient that she would need to be out of work for at least 2 weeks.  Patient states she will check with work and call us back when she is ready to schedule surgery.  Follow-Up Instructions: Return if symptoms worsen or fail to improve.   Ortho Exam  Patient is alert, oriented, no adenopathy, well-dressed, normal affect, normal respiratory effort. Examination patient has a good dorsalis pedis pulse she has swelling over the mid aspect of her Achilles incision there is no visible suture in the wound the wound is 2 cm in diameter with necrotic tissue at the base.  There is drainage no cellulitis.  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  Imaging: No results found. No images are attached to the encounter.  Labs: No results  found for: HGBA1C, ESRSEDRATE, CRP, LABURIC, REPTSTATUS, GRAMSTAIN, CULT, LABORGA   Lab Results  Component Value Date   ALBUMIN 4.1 04/07/2017   ALBUMIN 3.8 04/06/2017   ALBUMIN 3.7 04/05/2017    No results found for: MG No results found for: VD25OH  No results found for: PREALBUMIN CBC EXTENDED Latest Ref Rng & Units 11/04/2019 04/05/2017 04/04/2017  WBC 4.0 - 10.5 K/uL 2.0(L) 3.0(L) 4.2  RBC 3.87 - 5.11 MIL/uL 4.58 4.15 4.57  HGB 12.0 - 15.0 g/dL 73.7 10.6 26.9  HCT 36 - 46 % 39.6 36.2 40.7  PLT 150 - 400 K/uL 229 144(L) 150     Body mass index is 28.32 kg/m.  Orders:  No orders of the defined types were placed in this encounter.  No orders of the defined types were placed in this encounter.    Procedures: No procedures performed  Clinical Data: No additional findings.  ROS:  All other systems negative, except as noted in the HPI. Review of Systems  Objective: Vital Signs: Ht 5\' 4"  (1.626 m)   Wt 165 lb (74.8 kg)   BMI 28.32 kg/m   Specialty Comments:  No specialty comments available.  PMFS History: Patient Active Problem List   Diagnosis Date Noted  . 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 PONV (  postoperative nausea and vomiting)   . Seasonal allergies   . Umbilical hernia 12/2016    History reviewed. No pertinent family history.  Past Surgical History:  Procedure Laterality Date  . ACHILLES TENDON SURGERY Right 11/08/2019   Procedure: ACHILLES TENDON REPAIR;  Surgeon: Teryl Lucy, MD;  Location: [facility];  Service: Orthopedics;  Laterality: Right;  . ANTERIOR CRUCIATE LIGAMENT REPAIR Left   . INSERTION OF MESH N/A 01/08/2017   Procedure: INSERTION OF MESH;  Surgeon: Abigail Miyamoto, MD;  Location: [facility];  Service: General;  Laterality: N/A;  . UMBILICAL HERNIA REPAIR N/A 01/08/2017   Procedure: UMBILICAL HERNIA REPAIR;  Surgeon: Abigail Miyamoto,  MD;  Location: [facility];  Service: General;  Laterality: N/A;   Social History   Occupational History  . Not on file  Tobacco Use  . Smoking status: Never Smoker  . Smokeless tobacco: Never Used  Vaping Use  . Vaping Use: Never used  Substance and Sexual Activity  . Alcohol use: Yes    Comment: weekends  . Drug use: No  . Sexual activity: Yes    Birth control/protection: None

## 2020-04-13 ENCOUNTER — Telehealth: Payer: Self-pay | Admitting: Orthopedic Surgery

## 2020-04-13 NOTE — Telephone Encounter (Signed)
Pt called stating she and her mother tracy have been leaving messages with Elnita Maxwell to get surgery scheduled and has not received any CBs and at this point would like to speak with someone else to schedule surgery  [phone]

## 2020-04-13 NOTE — Telephone Encounter (Signed)
Left messages on both patient's mother's voicemail and the patient's voicemail for someone to return my call to discuss surgery scheduling.

## 2020-04-13 NOTE — Telephone Encounter (Signed)
Please see below.

## 2020-04-16 ENCOUNTER — Other Ambulatory Visit: Payer: Self-pay | Admitting: Physician Assistant

## 2020-04-17 ENCOUNTER — Other Ambulatory Visit: Payer: Self-pay

## 2020-04-17 ENCOUNTER — Other Ambulatory Visit (HOSPITAL_COMMUNITY)
Admission: RE | Admit: 2020-04-17 | Discharge: 2020-04-17 | Disposition: A | Discharge status: Home / Self Care | Payer: Managed Care, Other (non HMO) | Source: Ambulatory Visit | Attending: Orthopedic Surgery | Admitting: Orthopedic Surgery

## 2020-04-17 ENCOUNTER — Encounter (HOSPITAL_COMMUNITY): Payer: Self-pay | Admitting: Orthopedic Surgery

## 2020-04-17 DIAGNOSIS — Z01812 Encounter for preprocedural laboratory examination: Secondary | ICD-10-CM | POA: Insufficient documentation

## 2020-04-17 DIAGNOSIS — Z20822 Contact with and (suspected) exposure to covid-19: Secondary | ICD-10-CM | POA: Diagnosis not present

## 2020-04-17 LAB — SARS CORONAVIRUS 2 (TAT 6-24 HRS): SARS Coronavirus 2: NEGATIVE

## 2020-04-17 NOTE — Progress Notes (Signed)
Sydney Wallace denies chest pain or shortness of breath.  Sydney Wallace was tested for Covid  Today and is quarantining.

## 2020-04-18 ENCOUNTER — Encounter (HOSPITAL_COMMUNITY)
Admission: RE | Disposition: A | Discharge status: Home / Self Care | Payer: Self-pay | Source: Home / Self Care | Attending: Orthopedic Surgery

## 2020-04-18 ENCOUNTER — Ambulatory Visit (HOSPITAL_COMMUNITY): Payer: Managed Care, Other (non HMO)

## 2020-04-18 ENCOUNTER — Encounter (HOSPITAL_COMMUNITY): Payer: Self-pay | Admitting: Orthopedic Surgery

## 2020-04-18 ENCOUNTER — Ambulatory Visit (HOSPITAL_COMMUNITY)
Admission: RE | Admit: 2020-04-18 | Discharge: 2020-04-18 | Disposition: A | Discharge status: Home / Self Care | Payer: Managed Care, Other (non HMO) | Attending: Orthopedic Surgery | Admitting: Orthopedic Surgery

## 2020-04-18 DIAGNOSIS — T8189XA Other complications of procedures, not elsewhere classified, initial encounter: Secondary | ICD-10-CM | POA: Insufficient documentation

## 2020-04-18 DIAGNOSIS — L97419 Non-pressure chronic ulcer of right heel and midfoot with unspecified severity: Secondary | ICD-10-CM | POA: Diagnosis not present

## 2020-04-18 DIAGNOSIS — L02415 Cutaneous abscess of right lower limb: Secondary | ICD-10-CM

## 2020-04-18 DIAGNOSIS — Z20822 Contact with and (suspected) exposure to covid-19: Secondary | ICD-10-CM | POA: Insufficient documentation

## 2020-04-18 DIAGNOSIS — X58XXXA Exposure to other specified factors, initial encounter: Secondary | ICD-10-CM | POA: Insufficient documentation

## 2020-04-18 HISTORY — PX: I & D EXTREMITY: SHX5045

## 2020-04-18 HISTORY — DX: Unspecified asthma, uncomplicated: J45.909

## 2020-04-18 LAB — POCT PREGNANCY, URINE: Preg Test, Ur: NEGATIVE

## 2020-04-18 SURGERY — IRRIGATION AND DEBRIDEMENT EXTREMITY
Anesthesia: Regional | Laterality: Right

## 2020-04-18 MED ORDER — FENTANYL CITRATE (PF) 250 MCG/5ML IJ SOLN
INTRAMUSCULAR | Status: AC
  Filled 2020-04-18: qty 5

## 2020-04-18 MED ORDER — LACTATED RINGERS IV SOLN: INTRAVENOUS | Status: DC

## 2020-04-18 MED ORDER — PROPOFOL 10 MG/ML IV BOLUS
INTRAVENOUS | Status: AC
  Filled 2020-04-18: qty 40

## 2020-04-18 MED ORDER — CEFAZOLIN SODIUM-DEXTROSE 2-4 GM/100ML-% IV SOLN
INTRAVENOUS | Status: AC
  Filled 2020-04-18: qty 100

## 2020-04-18 MED ORDER — LIDOCAINE 2% (20 MG/ML) 5 ML SYRINGE
INTRAMUSCULAR | Status: DC | PRN
  Administered 2020-04-18: 100 mg via INTRAVENOUS

## 2020-04-18 MED ORDER — DEXAMETHASONE SODIUM PHOSPHATE 10 MG/ML IJ SOLN
INTRAMUSCULAR | Status: DC | PRN
  Administered 2020-04-18: 10 mg via INTRAVENOUS

## 2020-04-18 MED ORDER — ROPIVACAINE HCL 5 MG/ML IJ SOLN
INTRAMUSCULAR | Status: DC | PRN
  Administered 2020-04-18: 20 mL via PERINEURAL

## 2020-04-18 MED ORDER — DEXAMETHASONE SODIUM PHOSPHATE 10 MG/ML IJ SOLN
INTRAMUSCULAR | Status: DC | PRN
  Administered 2020-04-18: 10 mg

## 2020-04-18 MED ORDER — CEFAZOLIN SODIUM-DEXTROSE 2-4 GM/100ML-% IV SOLN
2.0000 g | INTRAVENOUS | Status: AC
  Administered 2020-04-18: 2 g via INTRAVENOUS

## 2020-04-18 MED ORDER — OXYCODONE HCL 5 MG PO TABS: 5.0000 mg | ORAL_TABLET | Freq: Once | ORAL | Status: DC | PRN

## 2020-04-18 MED ORDER — FENTANYL CITRATE (PF) 250 MCG/5ML IJ SOLN
INTRAMUSCULAR | Status: DC | PRN
  Administered 2020-04-18 (×2): 50 ug via INTRAVENOUS

## 2020-04-18 MED ORDER — KETOROLAC TROMETHAMINE 30 MG/ML IJ SOLN: 30.0000 mg | Freq: Once | INTRAMUSCULAR | Status: DC | PRN

## 2020-04-18 MED ORDER — SCOPOLAMINE 1 MG/3DAYS TD PT72
MEDICATED_PATCH | TRANSDERMAL | Status: AC
  Administered 2020-04-18: 1.5 mg via TRANSDERMAL
  Filled 2020-04-18: qty 1

## 2020-04-18 MED ORDER — SCOPOLAMINE 1 MG/3DAYS TD PT72: 1.0000 | MEDICATED_PATCH | TRANSDERMAL | Status: DC

## 2020-04-18 MED ORDER — 0.9 % SODIUM CHLORIDE (POUR BTL) OPTIME
TOPICAL | Status: DC | PRN
  Administered 2020-04-18: 1000 mL

## 2020-04-18 MED ORDER — HYDROMORPHONE HCL 1 MG/ML IJ SOLN
0.2500 mg | INTRAMUSCULAR | Status: DC | PRN
  Administered 2020-04-18: 0.5 mg via INTRAVENOUS

## 2020-04-18 MED ORDER — FENTANYL CITRATE (PF) 100 MCG/2ML IJ SOLN: 50.0000 ug | Freq: Once | INTRAMUSCULAR | Status: AC

## 2020-04-18 MED ORDER — ACETAMINOPHEN 500 MG PO TABS
ORAL_TABLET | ORAL | Status: AC
  Administered 2020-04-18: 1000 mg via ORAL
  Filled 2020-04-18: qty 2

## 2020-04-18 MED ORDER — ACETAMINOPHEN 500 MG PO TABS: 1000.0000 mg | ORAL_TABLET | Freq: Once | ORAL | Status: AC

## 2020-04-18 MED ORDER — MIDAZOLAM HCL 2 MG/2ML IJ SOLN
INTRAMUSCULAR | Status: AC
  Filled 2020-04-18: qty 2

## 2020-04-18 MED ORDER — FENTANYL CITRATE (PF) 100 MCG/2ML IJ SOLN
INTRAMUSCULAR | Status: AC
  Administered 2020-04-18: 50 ug via INTRAVENOUS
  Filled 2020-04-18: qty 2

## 2020-04-18 MED ORDER — MIDAZOLAM HCL 2 MG/2ML IJ SOLN
INTRAMUSCULAR | Status: AC
  Administered 2020-04-18: 2 mg via INTRAVENOUS
  Filled 2020-04-18: qty 2

## 2020-04-18 MED ORDER — ORAL CARE MOUTH RINSE: 15.0000 mL | Freq: Once | OROMUCOSAL | Status: AC

## 2020-04-18 MED ORDER — HYDROMORPHONE HCL 1 MG/ML IJ SOLN
INTRAMUSCULAR | Status: AC
  Filled 2020-04-18: qty 1

## 2020-04-18 MED ORDER — PROPOFOL 10 MG/ML IV BOLUS
INTRAVENOUS | Status: DC | PRN
  Administered 2020-04-18: 150 mg via INTRAVENOUS

## 2020-04-18 MED ORDER — MEPERIDINE HCL 25 MG/ML IJ SOLN: 6.2500 mg | INTRAMUSCULAR | Status: DC | PRN

## 2020-04-18 MED ORDER — OXYCODONE HCL 5 MG/5ML PO SOLN: 5.0000 mg | Freq: Once | ORAL | Status: DC | PRN

## 2020-04-18 MED ORDER — CHLORHEXIDINE GLUCONATE 0.12 % MT SOLN
OROMUCOSAL | Status: AC
  Administered 2020-04-18: 15 mL via OROMUCOSAL
  Filled 2020-04-18: qty 15

## 2020-04-18 MED ORDER — CHLORHEXIDINE GLUCONATE 0.12 % MT SOLN: 15.0000 mL | Freq: Once | OROMUCOSAL | Status: AC

## 2020-04-18 MED ORDER — ONDANSETRON HCL 4 MG/2ML IJ SOLN
INTRAMUSCULAR | Status: DC | PRN
  Administered 2020-04-18: 4 mg via INTRAVENOUS

## 2020-04-18 MED ORDER — PROMETHAZINE HCL 25 MG/ML IJ SOLN: 6.2500 mg | INTRAMUSCULAR | Status: DC | PRN

## 2020-04-18 MED ORDER — OXYCODONE-ACETAMINOPHEN 5-325 MG PO TABS
1.0000 | ORAL_TABLET | ORAL | 0 refills | Status: AC | PRN
Start: 2020-04-18 — End: 2021-04-18

## 2020-04-18 MED ORDER — MIDAZOLAM HCL 2 MG/2ML IJ SOLN: 2.0000 mg | Freq: Once | INTRAMUSCULAR | Status: AC

## 2020-04-18 SURGICAL SUPPLY — 22 items
BLADE SURG 21 STRL SS (BLADE) ×2 IMPLANT
COVER SURGICAL LIGHT HANDLE (MISCELLANEOUS) ×2 IMPLANT
DRAPE U-SHAPE 47X51 STRL (DRAPES) ×2 IMPLANT
DURAPREP 26ML APPLICATOR (WOUND CARE) ×2 IMPLANT
ELECT REM PT RETURN 9FT ADLT (ELECTROSURGICAL)
ELECTRODE REM PT RTRN 9FT ADLT (ELECTROSURGICAL) IMPLANT
GLOVE BIOGEL PI IND STRL 9 (GLOVE) ×1 IMPLANT
GLOVE BIOGEL PI INDICATOR 9 (GLOVE) ×1
GLOVE SURG ORTHO 9.0 STRL STRW (GLOVE) ×2 IMPLANT
GOWN STRL REUS W/ TWL XL LVL3 (GOWN DISPOSABLE) ×2 IMPLANT
GOWN STRL REUS W/TWL XL LVL3 (GOWN DISPOSABLE) ×2
KIT BASIN OR (CUSTOM PROCEDURE TRAY) ×2 IMPLANT
KIT TURNOVER KIT B (KITS) ×2 IMPLANT
MANIFOLD NEPTUNE II (INSTRUMENTS) ×2 IMPLANT
NS IRRIG 1000ML POUR BTL (IV SOLUTION) ×2 IMPLANT
PACK ORTHO EXTREMITY (CUSTOM PROCEDURE TRAY) ×4 IMPLANT
PREVENA RESTOR AXIOFORM 29X28 (GAUZE/BANDAGES/DRESSINGS) ×2 IMPLANT
STOCKINETTE IMPERVIOUS 9X36 MD (GAUZE/BANDAGES/DRESSINGS) IMPLANT
SUT ETHILON 2 0 PSLX (SUTURE) ×4 IMPLANT
TOWEL GREEN STERILE (TOWEL DISPOSABLE) ×2 IMPLANT
TUBE CONNECTING 12X1/4 (SUCTIONS) ×2 IMPLANT
YANKAUER SUCT BULB TIP NO VENT (SUCTIONS) ×2 IMPLANT

## 2020-04-18 NOTE — Anesthesia Procedure Notes (Signed)
Anesthesia Regional Block: Popliteal block   Pre-Anesthetic Checklist: ,, timeout performed, Correct Patient, Correct Site, Correct Laterality, Correct Procedure, Correct Position, site marked, Risks and benefits discussed,  Surgical consent,  Pre-op evaluation,  At surgeon's request and post-op pain management  Laterality: Right  Prep: Maximum Sterile Barrier Precautions used, chloraprep       Needles:  Injection technique: Single-shot  Needle Type: Echogenic Stimulator Needle     Needle Length: 9cm  Needle Gauge: 22     Additional Needles:   Procedures:,,,, ultrasound used (permanent image in chart),,,,  Narrative:  Start time: 04/18/2020 1:15 PM End time: 04/18/2020 1:20 PM Injection made incrementally with aspirations every 5 mL.  Performed by: Personally  Anesthesiologist: Lannie Fields, DO  Additional Notes: Monitors applied. No increased pain on injection. No increased resistance to injection. Injection made in 5cc increments. Good needle visualization. Patient tolerated procedure well.

## 2020-04-18 NOTE — Op Note (Signed)
04/18/2020  2:25 PM  PATIENT:  Sydney Wallace    PRE-OPERATIVE DIAGNOSIS:  Open Ulcer Right Achilles Tendon Status Post Tendon Reconstruction  POST-OPERATIVE DIAGNOSIS:  Same  PROCEDURE:  OPEN RIGHT ACHILLES DEBRIDEMENT Partial Achilles excision with excision of deep retained FiberWire suture and excision of cystic abscesses. Local tissue rearrangement for wound closure 9 x 5 cm. Application of axial form wound VAC dressing.  SURGEON:  Nadara Mustard, MD  PHYSICIAN ASSISTANT:None ANESTHESIA:   General  PREOPERATIVE INDICATIONS:  Sydney Wallace is a  29 y.o. female with a diagnosis of Open Ulcer Right Achilles Tendon Status Post Tendon Reconstruction who failed conservative measures and elected for surgical management.    The risks benefits and alternatives were discussed with the patient preoperatively including but not limited to the risks of infection, bleeding, nerve injury, cardiopulmonary complications, the need for revision surgery, among others, and the patient was willing to proceed.  OPERATIVE IMPLANTS: Axial form wound VAC dressing  @ENCIMAGES @  OPERATIVE FINDINGS: Large deep retained sutures with abscesses within the Achilles tendon  OPERATIVE PROCEDURE: Patient was brought the operating room and underwent a general anesthetic.  After adequate levels anesthesia were obtained patient's right lower extremity was prepped using DuraPrep draped into a sterile field a timeout was called.  An elliptical incision was made around the ulcerative tissue over the Achilles this left a wound that was 9 x 5 cm.  There is large cystic structures around the retained suture.  The cystic areas of the Achilles were resected the deep retained FiberWire suture was removed.  The Achilles tendon circumference was undermined to allow for soft tissue local rearrangement.  After debridement and cleansing of the Achilles tendon local tissue rearrangement was used to close the wound 9 x 5 cm.  2-0 nylon was  used in a .  A Praveena axial form wound dressing was applied this had a good suction fit this was overwrapped with Covan.  Patient was taken to PACU in stable condition   DISCHARGE PLANNING:  Antibiotic duration: Preoperative antibiotics  Weightbearing: Weightbearing as tolerated  Pain medication: Prescription for Percocet  Dressing care/ Wound VAC: Continue wound VAC for 1 week  Ambulatory devices: As needed  Discharge to: Home.  Follow-up: In the office 1 week post operative.

## 2020-04-18 NOTE — Anesthesia Preprocedure Evaluation (Addendum)
Anesthesia Evaluation  Patient identified by MRN, date of birth, ID band Patient awake    Reviewed: Allergy & Precautions, NPO status , Patient's Chart, lab work & pertinent test results  History of Anesthesia Complications Negative for: history of anesthetic complications  Airway Mallampati: I  TM Distance: >3 FB Neck ROM: Full    Dental  (+) Teeth Intact, Dental Advisory Given   Pulmonary asthma (childhood asthma) , Patient abstained from smoking.,    Pulmonary exam normal breath sounds clear to auscultation       Cardiovascular negative cardio ROS Normal cardiovascular exam Rhythm:Regular Rate:Normal     Neuro/Psych negative neurological ROS  negative psych ROS   GI/Hepatic negative GI ROS, (+)     substance abuse  alcohol use,   Endo/Other  negative endocrine ROS  Renal/GU negative Renal ROS  negative genitourinary   Musculoskeletal Open ulcer right achilles tendon s/p reconstruction    Abdominal   Peds  Hematology negative hematology ROS (+)   Anesthesia Other Findings   Reproductive/Obstetrics negative OB ROS                            Anesthesia Physical Anesthesia Plan  ASA: II  Anesthesia Plan: General and Regional   Post-op Pain Management: GA combined w/ Regional for post-op pain   Induction: Intravenous  PONV Risk Score and Plan: 3 and Ondansetron, Dexamethasone, Scopolamine patch - Pre-op and Midazolam  Airway Management Planned: Oral ETT  Additional Equipment: None  Intra-op Plan:   Post-operative Plan: Extubation in OR  Informed Consent: I have reviewed the patients History and Physical, chart, labs and discussed the procedure including the risks, benefits and alternatives for the proposed anesthesia with the patient or authorized representative who has indicated his/her understanding and acceptance.     Dental advisory given  Plan Discussed with:  CRNA  Anesthesia Plan Comments:        Anesthesia Quick Evaluation

## 2020-04-18 NOTE — H&P (Signed)
Sydney Wallace is an 29 y.o. female.   Chief Complaint: Right Achilles wound HPI: HPI: Patient is a 29 year old woman who was seen in follow-up for open ulcer right Achilles tendon status post tendon reconstruction in March.  She has been seen several times me to remove some suture and the wound has healed some but there is still persistent swelling open wound and drainage.  Past Medical History:  Diagnosis Date  . Asthma    as a child - "grew out it"  . PONV (postoperative nausea and vomiting)   . Seasonal allergies   . Umbilical hernia 12/2016    Past Surgical History:  Procedure Laterality Date  . ACHILLES TENDON SURGERY Right 11/08/2019   Procedure: ACHILLES TENDON REPAIR;  Surgeon: Teryl Lucy, MD;  Location: [facility];  Service: Orthopedics;  Laterality: Right;  . ANTERIOR CRUCIATE LIGAMENT REPAIR Left   . INSERTION OF MESH N/A 01/08/2017   Procedure: INSERTION OF MESH;  Surgeon: Abigail Miyamoto, MD;  Location: [facility];  Service: General;  Laterality: N/A;  . UMBILICAL HERNIA REPAIR N/A 01/08/2017   Procedure: UMBILICAL HERNIA REPAIR;  Surgeon: Abigail Miyamoto, MD;  Location:  SURGERY CENTER;  Service: General;  Laterality: N/A;    No family history on file. Social History:  reports that she has never smoked. She has never used smokeless tobacco. She reports current alcohol use of about 9.0 standard drinks of alcohol per week. She reports that she does not use drugs.  Allergies: No Known Allergies  No medications prior to admission.    Results for orders placed or performed during the hospital encounter of 04/17/20 (from the past 48 hour(s))  SARS CORONAVIRUS 2 (TAT 6-24 HRS) Nasopharyngeal Nasopharyngeal Swab     Status: None   Collection Time: 04/17/20 11:05 AM   Specimen: Nasopharyngeal Swab  Result Value Ref Range   SARS Coronavirus 2 NEGATIVE NEGATIVE    Comment: (NOTE) SARS-CoV-2 target nucleic acids are NOT  DETECTED.  The SARS-CoV-2 RNA is generally detectable in upper and lower respiratory specimens during the acute phase of infection. Negative results do not preclude SARS-CoV-2 infection, do not rule out co-infections with other pathogens, and should not be used as the sole basis for treatment or other patient management decisions. Negative results must be combined with clinical observations, patient history, and epidemiological information. The expected result is Negative.  Fact Sheet for Patients: HairSlick.no  Fact Sheet for Healthcare Providers: quierodirigir.com  This test is not yet approved or cleared by the [place] FDA and  has been authorized for detection and/or diagnosis of SARS-CoV-2 by FDA under an Emergency Use Authorization (EUA). This EUA will remain  in effect (meaning this test can be used) for the duration of the COVID-19 declaration under Se ction 564(b)(1) of the Act, 21 U.S.C. section 360bbb-3(b)(1), unless the authorization is terminated or revoked sooner.  Performed at [facility], [address] [place], [place] [address]    No results found.  Review of Systems  All other systems reviewed and are negative.   Last menstrual period 04/11/2020. Physical Exam  Patient is alert, oriented, no adenopathy, well-dressed, normal affect, normal respiratory effort. Examination patient has a good dorsalis pedis pulse she has swelling over the mid aspect of her Achilles incision there is no visible suture in the wound the wound is 2 cm in diameter with necrotic tissue at the base.  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 Assessment/Plan Plan: With the persistent ulceration and drainage have recommended at this time that we would need to proceed with formal surgical debridement of the Achilles tendon removal of all retained suture wound  closure and placement of a axial form wound VAC dressing.  Discussed with the patient that she would need to be out of work for at least 2 weeks.  Patient states she will check with work and call us back when she is ready to schedule surgery.   [place] Bali Krishon Adkison, PA 04/18/2020, 6:34 AM

## 2020-04-18 NOTE — Anesthesia Postprocedure Evaluation (Signed)
Anesthesia Post Note  Patient: Tephanie Escorcia  Procedure(s) Performed: OPEN RIGHT ACHILLES DEBRIDEMENT (Right )     Patient location during evaluation: PACU Anesthesia Type: Regional and General Level of consciousness: awake and alert, oriented and patient cooperative Pain management: pain level controlled Vital Signs Assessment: post-procedure vital signs reviewed and stable Respiratory status: spontaneous breathing, nonlabored ventilation and respiratory function stable Cardiovascular status: blood pressure returned to baseline and stable Postop Assessment: no apparent nausea or vomiting Anesthetic complications: no   No complications documented.  Last Vitals:  Vitals:   04/18/20 1425 04/18/20 1440  BP: (!) 142/88 118/81  Pulse: (!) 105 65  Resp: 15 15  Temp: (!) 36.3 C   SpO2: 100% 100%    Last Pain:  Vitals:   04/18/20 1500  TempSrc:   PainSc: 3                  Lannie Fields

## 2020-04-18 NOTE — Progress Notes (Signed)
Orthopedic Tech Progress Note Patient Details:  Sydney Wallace July 22, 1991 [id number] PACU RN called requesting a CAM WALKER BOOT Ortho Devices Type of Ortho Device: CAM walker Ortho Device/Splint Location: RLE Ortho Device/Splint Interventions: Application, Adjustment   Post Interventions Patient Tolerated: Well Instructions Provided: Care of device   Donald Pore 04/18/2020, 3:46 PM

## 2020-04-18 NOTE — Transfer of Care (Signed)
Immediate Anesthesia Transfer of Care Note  Patient: Turner Kunzman  Procedure(s) Performed: OPEN RIGHT ACHILLES DEBRIDEMENT (Right )  Patient Location: PACU  Anesthesia Type:General  Level of Consciousness: drowsy and patient cooperative  Airway & Oxygen Therapy: Patient Spontanous Breathing and Patient connected to face mask oxygen  Post-op Assessment: Report given to RN, Post -op Vital signs reviewed and stable and Patient moving all extremities  Post vital signs: Reviewed and stable  Last Vitals:  Vitals Value Taken Time  BP 142/88 04/18/20 1425  Temp    Pulse 105 04/18/20 1425  Resp    SpO2 100 % 04/18/20 1425  Vitals shown include unvalidated device data.  Last Pain:  Vitals:   04/18/20 1054  TempSrc:   PainSc: 0-No pain      Patients Stated Pain Goal: 5 (04/18/20 1054)  Complications: No complications documented.

## 2020-04-18 NOTE — Anesthesia Procedure Notes (Signed)
Procedure Name: LMA Insertion Date/Time: 04/18/2020 1:48 PM Performed by: Alease Medina, CRNA Pre-anesthesia Checklist: Patient identified, Emergency Drugs available, Suction available and Patient being monitored Patient Re-evaluated:Patient Re-evaluated prior to induction Oxygen Delivery Method: Circle system utilized Preoxygenation: Pre-oxygenation with 100% oxygen Induction Type: IV induction Ventilation: Mask ventilation without difficulty LMA: LMA inserted LMA Size: 4.0 Number of attempts: 1 Placement Confirmation: breath sounds checked- equal and bilateral and CO2 detector Tube secured with: Tape Dental Injury: Teeth and Oropharynx as per pre-operative assessment  Comments: Performed by Letta Pate, srna

## 2020-04-19 ENCOUNTER — Encounter (HOSPITAL_COMMUNITY): Payer: Self-pay | Admitting: Orthopedic Surgery

## 2020-04-24 ENCOUNTER — Telehealth: Payer: Self-pay | Admitting: Orthopedic Surgery

## 2020-04-24 NOTE — Telephone Encounter (Signed)
Patient called.   She is requesting a call back to raise some questions she has about a upcoming surgery.   Call back: [phone]

## 2020-04-24 NOTE — Telephone Encounter (Signed)
Patient was called and scheduled to come in for tomorrow. She was concerns about her wound vac not draining and not having any upcoming appt. Patient will be seen at 10:30 am.

## 2020-04-25 ENCOUNTER — Ambulatory Visit (INDEPENDENT_AMBULATORY_CARE_PROVIDER_SITE_OTHER): Payer: Managed Care, Other (non HMO) | Admitting: Family

## 2020-04-25 ENCOUNTER — Encounter: Payer: Self-pay | Admitting: Family

## 2020-04-25 DIAGNOSIS — L97411 Non-pressure chronic ulcer of right heel and midfoot limited to breakdown of skin: Secondary | ICD-10-CM

## 2020-04-25 DIAGNOSIS — Z09 Encounter for follow-up examination after completed treatment for conditions other than malignant neoplasm: Secondary | ICD-10-CM

## 2020-04-25 NOTE — Progress Notes (Signed)
   Post-Op Visit Note   Patient: Sydney Wallace           Date of Birth: 01/31/1991           MRN: [id number] Visit Date: 04/25/2020 PCP: [facility]  Chief Complaint: No chief complaint on file.   HPI:  HPI The patient is a 29 year old woman who presents today status post debridement of her right Achilles following abscess.  Ortho Exam Incision approximated sutures unfortunately there has been some gaping the distal 3 cm is gaped about 5 mm.  There is granulation in the wound bed there is no active drainage no surrounding erythema or sign of infection  Visit Diagnoses:  1. Postop check   2. 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  Dry dressing changes.  She will back off on her weightbearing discussed using crutches minimizing weightbearing elevating for swelling  Follow-Up Instructions: Return in about 2 weeks (around 05/09/2020).   Imaging: No results found.  Orders:  No orders of the defined types were placed in this encounter.  No orders of the defined types were placed in this encounter.    PMFS History: Patient Active Problem List   Diagnosis Date Noted  . Abscess of right lower leg   . 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 Asthma    as a child - "grew out it"  . PONV (postoperative nausea and vomiting)   . Seasonal allergies   . Umbilical hernia 12/2016    History reviewed. No pertinent family history.  Past Surgical History:  Procedure Laterality Date  . ACHILLES TENDON SURGERY Right 11/08/2019   Procedure: ACHILLES TENDON REPAIR;  Surgeon: Teryl Lucy, MD;  Location: [facility];  Service: Orthopedics;  Laterality: Right;  . ANTERIOR CRUCIATE LIGAMENT REPAIR Left   . I & D EXTREMITY Right 04/18/2020   Procedure: OPEN RIGHT ACHILLES DEBRIDEMENT;  Surgeon: Nadara Mustard, MD;  [facility];  Service: Orthopedics;  Laterality: Right;  . INSERTION OF MESH N/A 01/08/2017   Procedure: INSERTION OF MESH;  Surgeon: Abigail Miyamoto, MD;  Location: [facility];  Service: General;  Laterality: N/A;  . UMBILICAL HERNIA REPAIR N/A 01/08/2017   Procedure: UMBILICAL HERNIA REPAIR;  Surgeon: Abigail Miyamoto, MD;  Location: [facility];  Service: General;  Laterality: N/A;   Social History   Occupational History  . Not on file  Tobacco Use  . Smoking status: Never Smoker  . Smokeless tobacco: Never Used  Vaping Use  . Vaping Use: Some days  Substance and Sexual Activity  . Alcohol use: Yes    Alcohol/week: 9.0 standard drinks    Types: 3 Glasses of wine, 6 Standard drinks or equivalent per week    Comment: weekends  . Drug use: No  . Sexual activity: Yes    Birth control/protection: None

## 2020-04-27 ENCOUNTER — Ambulatory Visit (INDEPENDENT_AMBULATORY_CARE_PROVIDER_SITE_OTHER): Payer: Managed Care, Other (non HMO) | Admitting: Physician Assistant

## 2020-04-27 ENCOUNTER — Encounter: Payer: Self-pay | Admitting: Physician Assistant

## 2020-04-27 VITALS — Ht 64.0 in | Wt 160.0 lb

## 2020-04-27 DIAGNOSIS — L97411 Non-pressure chronic ulcer of right heel and midfoot limited to breakdown of skin: Secondary | ICD-10-CM

## 2020-04-27 MED ORDER — HYDROCODONE-ACETAMINOPHEN 5-325 MG PO TABS: 1.0000 | ORAL_TABLET | Freq: Four times a day (QID) | ORAL | 0 refills | Status: DC | PRN

## 2020-04-27 NOTE — Progress Notes (Signed)
   Post-Op Visit Note   Patient: Sydney Wallace           Date of Birth: February 19, 1991           MRN: [id number] Visit Date: 04/27/2020 PCP: [facility]  Chief Complaint:  Chief Complaint  Patient presents with   Right Achilles Tendon - Routine Post Op    04/18/20 achilles debridement     HPI:  HPI The patient is seen today in follow-up she is status post Achilles debridement on August 11.  She today was doing her dressing change and was concerned about the area of dehiscence.  Her mother accompanies.  Ortho Exam On examination of the right Achilles incision this is largely unchanged from viewing 2 days ago.  There is no further dehiscence no surrounding erythema no maceration no active drainage 1 drop of blood.  Visit Diagnoses: No diagnosis found.  Plan: Reassurance provided.  Again discussed the importance of elevation keeping the incision cleanse daily with dressing changes.  Nonweightbearing.  Follow-Up Instructions: No follow-ups on file.   Imaging: No results found.  Orders:  No orders of the defined types were placed in this encounter.  Meds ordered this encounter  Medications   DISCONTD: HYDROcodone-acetaminophen (NORCO) 5-325 MG tablet    Sig: Take 1 tablet by mouth every 6 (six) hours as needed.    Dispense:  30 tablet    Refill:  0   HYDROcodone-acetaminophen (NORCO) 5-325 MG tablet    Sig: Take 1 tablet by mouth every 6 (six) hours as needed.    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 No pertinent family history.  Past Surgical History:  Procedure Laterality Date   ACHILLES  TENDON SURGERY Right 11/08/2019   Procedure: ACHILLES TENDON REPAIR;  Surgeon: Teryl Lucy, MD;  Location: [facility];  Service: Orthopedics;  Laterality: Right;   ANTERIOR CRUCIATE LIGAMENT REPAIR Left    I & D EXTREMITY Right 04/18/2020   Procedure: OPEN RIGHT ACHILLES DEBRIDEMENT;  Surgeon: Nadara Mustard, MD;  Location: [facility];  Service: Orthopedics;  Laterality: Right;   INSERTION OF MESH N/A 01/08/2017   Procedure: INSERTION OF MESH;  Surgeon: Abigail Miyamoto, MD;  Location: [facility];  Service: General;  Laterality: N/A;   UMBILICAL HERNIA REPAIR N/A 01/08/2017   Procedure: UMBILICAL HERNIA REPAIR;  Surgeon: Abigail Miyamoto, MD;  Location: [facility];  Service: General;  Laterality: N/A;   Social History   Occupational History   Not on file  Tobacco Use   Smoking status: Never Smoker   Smokeless tobacco: Never Used  Vaping Use   Vaping Use: Some days  Substance and Sexual Activity   Alcohol use: Yes    Alcohol/week: 9.0 standard drinks    Types: 3 Glasses of wine, 6 Standard drinks or equivalent per week    Comment: weekends   Drug use: No   Sexual activity: Yes    Birth control/protection: None

## 2020-05-03 ENCOUNTER — Encounter: Payer: Self-pay | Admitting: Orthopedic Surgery

## 2020-05-03 ENCOUNTER — Ambulatory Visit (INDEPENDENT_AMBULATORY_CARE_PROVIDER_SITE_OTHER): Payer: Managed Care, Other (non HMO) | Admitting: Orthopedic Surgery

## 2020-05-03 DIAGNOSIS — L97411 Non-pressure chronic ulcer of right heel and midfoot limited to breakdown of skin: Secondary | ICD-10-CM

## 2020-05-03 NOTE — Progress Notes (Signed)
Office Visit Note   Patient: Sydney Wallace           Date of Birth: [birth year]           MRN: [id number] Visit Date: 05/03/2020              Requested by: [facility] [address] [place],  [place] [address] PCP: [facility]  Chief Complaint  Patient presents with  . Right Foot - Pain, Follow-up      HPI: Patient is a 29 year old woman who presents 2 weeks status post debridement of abscess and retained foreign bodies in her Achilles with skin breakdown.  Patient states she is doing well she states the swelling is significantly better than it was before surgery.  She states she has not taken Vicodin for the past 2 to 3 days.  Assessment & Plan: Visit Diagnoses:  1. 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  We will place her in a short fracture boot with a 916 inch heel lift to further unload the Achilles area she will continue with routine wound care with Dial soap cleansing and an Ace wrap.  Follow-Up Instructions: Return in about 1 week (around 05/10/2020).   Ortho Exam  Patient is alert, oriented, no adenopathy, well-dressed, normal affect, normal respiratory effort. Examination patient has decreased swelling in the right lower extremity the majority of the wound has healed completely the mid substance is gaped open 10 mm in length 5 mm in width with 100% granulation tissue there is no odor no drainage no cellulitis or dermatitis there is no sign of infection.  Imaging: No results found. No images are attached to the encounter.  Labs: No results found for: HGBA1C, ESRSEDRATE, CRP, LABURIC, REPTSTATUS, GRAMSTAIN, CULT, LABORGA   Lab Results  Component Value Date   ALBUMIN 4.1 04/07/2017   ALBUMIN 3.8 04/06/2017   ALBUMIN 3.7 04/05/2017    No results found for: MG No results found for:  VD25OH  No results found for: PREALBUMIN CBC EXTENDED Latest Ref Rng & Units 11/04/2019 04/05/2017 04/04/2017  WBC 4.0 - 10.5 K/uL 2.0(L) 3.0(L) 4.2  RBC 3.87 - 5.11 MIL/uL 4.58 4.15 4.57  HGB 12.0 - 15.0 g/dL 10.2 72.5 36.6  HCT 36 - 46 % 39.6 36.2 40.7  PLT 150 - 400 K/uL 229 144(L) 150     There is no height or weight on file to calculate BMI.  Orders:  No orders of the defined types were placed in this encounter.  No orders of the defined types were placed in this encounter.    Procedures: No procedures performed  Clinical Data: No additional findings.  ROS:  All other systems negative, except as noted in the HPI. Review of Systems  Objective: Vital Signs: LMP 04/11/2020   Specialty Comments:  No specialty comments available.  PMFS History: Patient Active Problem List   Diagnosis Date Noted  . Abscess of right lower leg   . 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 Asthma    as a child - "grew out it"  . PONV (postoperative nausea and vomiting)   . Seasonal allergies   . Umbilical hernia 12/2016    History reviewed. No pertinent family history.  Past Surgical History:  Procedure Laterality Date  .  ACHILLES TENDON SURGERY Right 11/08/2019   Procedure: ACHILLES TENDON REPAIR;  Surgeon: Teryl Lucy, MD;  Location: [facility];  Service: Orthopedics;  Laterality: Right;  . ANTERIOR CRUCIATE LIGAMENT REPAIR Left   . I & D EXTREMITY Right 04/18/2020   Procedure: OPEN RIGHT ACHILLES DEBRIDEMENT;  Surgeon: Nadara Mustard, MD;  Location: [facility] OR;  Service: Orthopedics;  Laterality: Right;  . INSERTION OF MESH N/A 01/08/2017   Procedure: INSERTION OF MESH;  Surgeon: Abigail Miyamoto, MD;  Location: [facility];  Service: General;  Laterality: N/A;  . UMBILICAL HERNIA REPAIR N/A 01/08/2017   Procedure: UMBILICAL HERNIA REPAIR;  Surgeon: Abigail Miyamoto, MD;  Location: [facility];  Service: General;  Laterality: N/A;   Social History   Occupational History  . Not on file  Tobacco Use  . Smoking status: Never Smoker  . Smokeless tobacco: Never Used  Vaping Use  . Vaping Use: Some days  Substance and Sexual Activity  . Alcohol use: Yes    Alcohol/week: 9.0 standard drinks    Types: 3 Glasses of wine, 6 Standard drinks or equivalent per week    Comment: weekends  . Drug use: No  . Sexual activity: Yes    Birth control/protection: None

## 2020-05-09 ENCOUNTER — Ambulatory Visit: Payer: Managed Care, Other (non HMO) | Admitting: Family

## 2020-05-10 ENCOUNTER — Encounter: Payer: Self-pay | Admitting: Orthopedic Surgery

## 2020-05-10 ENCOUNTER — Ambulatory Visit (INDEPENDENT_AMBULATORY_CARE_PROVIDER_SITE_OTHER): Payer: Managed Care, Other (non HMO) | Admitting: Orthopedic Surgery

## 2020-05-10 VITALS — Ht 64.0 in | Wt 160.0 lb

## 2020-05-10 DIAGNOSIS — L97411 Non-pressure chronic ulcer of right heel and midfoot limited to breakdown of skin: Secondary | ICD-10-CM

## 2020-05-11 ENCOUNTER — Encounter: Payer: Self-pay | Admitting: Orthopedic Surgery

## 2020-05-11 NOTE — Progress Notes (Signed)
Office Visit Note   Patient: Sydney Wallace           Date of Birth: [birth year]           MRN: [id number] Visit Date: 05/10/2020              Requested by: [facility] [address] [place],  [place] [address] PCP: [facility]  Chief Complaint  Patient presents with  . Right Achilles Tendon - Routine Post Op    04/18/20 right achilles debridement       HPI: Patient is a 29 year old woman who is seen in follow-up status post debridement right Achilles tendon with infected deep retained FiberWire suture the suture and cystic tender changes were excised.  Patient feels like she is making excellent improvement.  Assessment & Plan: Visit Diagnoses:  1. 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  Follow-Up Instructions: Return in about 2 weeks (around 05/24/2020).   Ortho Exam  Patient is alert, oriented, no adenopathy, well-dressed, normal affect, normal respiratory effort. Examination the incision is almost completely healed we will harvest the sutures today there is a small area of granulation tissue that is 3 x 5 mm and 0.1 mm deep.  There is no cellulitis no drainage no depth to the wound no signs of infection.  Imaging: No results found. No images are attached to the encounter.  Labs: No results found for: HGBA1C, ESRSEDRATE, CRP, LABURIC, REPTSTATUS, GRAMSTAIN, CULT, LABORGA   Lab Results  Component Value Date   ALBUMIN 4.1 04/07/2017   ALBUMIN 3.8 04/06/2017   ALBUMIN 3.7 04/05/2017    No results found for: MG No results found for: VD25OH  No results found for: PREALBUMIN CBC EXTENDED Latest Ref Rng & Units 11/04/2019 04/05/2017 04/04/2017  WBC 4.0 - 10.5 K/uL 2.0(L) 3.0(L) 4.2  RBC 3.87 - 5.11 MIL/uL 4.58 4.15 4.57   HGB 12.0 - 15.0 g/dL 29.5 18.8 41.6  HCT 36 - 46 % 39.6 36.2 40.7  PLT 150 - 400 K/uL 229 144(L) 150     Body mass index is 27.46 kg/m.  Orders:  No orders of the defined types were placed in this encounter.  No orders of the defined types were placed in this encounter.    Procedures: No procedures performed  Clinical Data: No additional findings.  ROS:  All other systems negative, except as noted in the HPI. Review of Systems  Objective: Vital Signs: Ht 5\' 4"  (1.626 m)   Wt 160 lb (72.6 kg)   LMP 04/11/2020   BMI 27.46 kg/m   Specialty Comments:  No specialty comments available.  PMFS History: Patient Active Problem List   Diagnosis Date Noted  . Abscess of right lower leg   . 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 Asthma    as a child - "grew out it"  . PONV (postoperative nausea and vomiting)   . Seasonal allergies   . Umbilical hernia 12/2016    History reviewed. No pertinent family history.  Past Surgical History:  Procedure Laterality Date  . ACHILLES TENDON SURGERY Right 11/08/2019   Procedure: ACHILLES TENDON REPAIR;  Surgeon: 01/08/2020, MD;  Location: [facility];  Service: Orthopedics;  Laterality: Right;  . ANTERIOR CRUCIATE LIGAMENT REPAIR Left   . I & D EXTREMITY Right 04/18/2020   Procedure: OPEN RIGHT ACHILLES DEBRIDEMENT;  Surgeon: Nadara Mustard, MD;  Location: [facility];  Service: Orthopedics;  Laterality: Right;  . INSERTION OF MESH N/A 01/08/2017   Procedure: INSERTION OF MESH;  Surgeon: Abigail Miyamoto, MD;  Location: [facility];  Service: General;  Laterality: N/A;  . UMBILICAL HERNIA REPAIR N/A 01/08/2017   Procedure: UMBILICAL HERNIA REPAIR;  Surgeon: Abigail Miyamoto, MD;  Location: [facility];  Service: General;  Laterality: N/A;   Social History   Occupational History  . Not on file  Tobacco Use  . Smoking status: Never  Smoker  . Smokeless tobacco: Never Used  Vaping Use  . Vaping Use: Some days  Substance and Sexual Activity  . Alcohol use: Yes    Alcohol/week: 9.0 standard drinks    Types: 3 Glasses of wine, 6 Standard drinks or equivalent per week    Comment: weekends  . Drug use: No  . Sexual activity: Yes    Birth control/protection: None

## 2020-05-18 ENCOUNTER — Telehealth: Payer: Self-pay | Admitting: Orthopedic Surgery

## 2020-05-18 NOTE — Telephone Encounter (Signed)
Patient called asked if she can get a note to return back to work Monday without restrictions. Patient said she will pick up note. The number to contact patient is [phone]

## 2020-05-18 NOTE — Telephone Encounter (Signed)
That is fine 

## 2020-05-18 NOTE — Telephone Encounter (Signed)
Sending to you to follow up on next week

## 2020-05-18 NOTE — Telephone Encounter (Signed)
Okay 

## 2020-05-18 NOTE — Telephone Encounter (Signed)
Note completed. Tried calling patient to advise. Waiting for return call to see if patient wants faxed or to pick up at front desk.

## 2020-05-21 NOTE — Telephone Encounter (Signed)
Pt came in the office this morning and picked up note.

## 2020-05-24 ENCOUNTER — Ambulatory Visit: Payer: Managed Care, Other (non HMO) | Admitting: Orthopedic Surgery

## 2020-05-29 ENCOUNTER — Ambulatory Visit: Payer: Managed Care, Other (non HMO) | Admitting: Family

## 2020-06-05 ENCOUNTER — Encounter: Payer: Self-pay | Admitting: Family

## 2020-06-05 ENCOUNTER — Ambulatory Visit (INDEPENDENT_AMBULATORY_CARE_PROVIDER_SITE_OTHER): Payer: Managed Care, Other (non HMO) | Admitting: Physician Assistant

## 2020-06-05 VITALS — Ht 64.0 in | Wt 160.0 lb

## 2020-06-05 DIAGNOSIS — L97411 Non-pressure chronic ulcer of right heel and midfoot limited to breakdown of skin: Secondary | ICD-10-CM

## 2020-06-05 NOTE — Progress Notes (Signed)
Office Visit Note   Patient: Sydney Wallace           Date of Birth: June 22, 1991           MRN: [id number] Visit Date: 06/05/2020              Requested by: [facility] [address] [place],  [place] [address] PCP: [facility]  Chief Complaint  Patient presents with  . Right Achilles Tendon - Routine Post Op    04/18/20 right achilles debridement       HPI: Patient presents today for follow-up and removal of retained suture status post open Achilles tendon reconstruction.  She is actually doing fairly well she has had no further drainage she just has 1 small area of eschar.  She does have some hypertrophic scarring and dryness around the incision wondering what she can do about that.  She is back in the gym and working hard.  Her only concern is that her calf muscle seems to still have difficulty with strengthening  Assessment & Plan: Visit Diagnoses: No diagnosis found.  Plan: I gave her some information on the [place] protocol for eccentric calf strengthening.  She is going to look into this as well.  I recommended using cocoa butter and vitamin E oil with soft tissue massage over the scar and keeping it out of the sun. Follow-Up Instructions: No follow-ups on file.   Ortho Exam  Patient is alert, oriented, no adenopathy, well-dressed, normal affect, normal respiratory effort. Focused examination demonstrates overall well-healed incision without any drainage no surrounding cellulitis minimal tenderness.  She does dorsiflex past 90 degrees without much difficulty.  She has a very small eschar in the central portion of the incision.  Imaging: No results found. No images are attached to the encounter.  Labs: No results found for: HGBA1C, ESRSEDRATE, CRP, LABURIC, REPTSTATUS, GRAMSTAIN, CULT, LABORGA   Lab Results  Component Value Date   ALBUMIN 4.1 04/07/2017   ALBUMIN 3.8 04/06/2017   ALBUMIN 3.7 04/05/2017     No results found for: MG No results found for: VD25OH  No results found for: PREALBUMIN CBC EXTENDED Latest Ref Rng & Units 11/04/2019 04/05/2017 04/04/2017  WBC 4.0 - 10.5 K/uL 2.0(L) 3.0(L) 4.2  RBC 3.87 - 5.11 MIL/uL 4.58 4.15 4.57  HGB 12.0 - 15.0 g/dL 14.4 31.5 40.0  HCT 36 - 46 % 39.6 36.2 40.7  PLT 150 - 400 K/uL 229 144(L) 150     Body mass index is 27.46 kg/m.  Orders:  No orders of the defined types were placed in this encounter.  No orders of the defined types were placed in this encounter.    Procedures: No procedures performed  Clinical Data: No additional findings.  ROS:  All other systems negative, except as noted in the HPI. Review of Systems  Objective: Vital Signs: Ht 5\' 4"  (1.626 m)   Wt 160 lb (72.6 kg)   BMI 27.46 kg/m   Specialty Comments:  No specialty comments available.  PMFS History: Patient Active Problem List   Diagnosis Date Noted  . Abscess of right lower leg   . 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 Asthma    as a child - "grew out it"  . PONV (postoperative nausea and vomiting)   . Seasonal allergies   . Umbilical hernia 12/2016    No family history on file.  Past Surgical History:  Procedure Laterality Date  . ACHILLES TENDON SURGERY Right 11/08/2019   Procedure: ACHILLES TENDON REPAIR;  Surgeon: Teryl Lucy, MD;  Location: [facility];  Service: Orthopedics;  Laterality: Right;  . ANTERIOR CRUCIATE LIGAMENT REPAIR Left   . I & D EXTREMITY Right 04/18/2020   Procedure: OPEN RIGHT ACHILLES DEBRIDEMENT;  Surgeon: Nadara Mustard, MD;  Location: [facility] OR;  Service: Orthopedics;  Laterality: Right;  . INSERTION OF MESH N/A 01/08/2017   Procedure: INSERTION OF MESH;  Surgeon: Abigail Miyamoto, MD;  Location: [facility];  Service: General;  Laterality: N/A;  . UMBILICAL HERNIA REPAIR N/A 01/08/2017   Procedure: UMBILICAL HERNIA REPAIR;   Surgeon: Abigail Miyamoto, MD;  Location:  SUR[facility]ERY CENTER;  Service: General;  Laterality: N/A;   Social History   Occupational History  . Not on file  Tobacco Use  . Smoking status: Never Smoker  . Smokeless tobacco: Never Used  Vaping Use  . Vaping Use: Some days  Substance and Sexual Activity  . Alcohol use: Yes    Alcohol/week: 9.0 standard drinks    Types: 3 Glasses of wine, 6 Standard drinks or equivalent per week    Comment: weekends  . Drug use: No  . Sexual activity: Yes    Birth control/protection: None

## 2020-07-10 ENCOUNTER — Encounter: Payer: Self-pay | Admitting: Family

## 2020-07-10 ENCOUNTER — Ambulatory Visit (INDEPENDENT_AMBULATORY_CARE_PROVIDER_SITE_OTHER): Payer: Managed Care, Other (non HMO) | Admitting: Physician Assistant

## 2020-07-10 DIAGNOSIS — L02415 Cutaneous abscess of right lower limb: Secondary | ICD-10-CM

## 2020-07-10 NOTE — Progress Notes (Signed)
Office Visit Note   Patient: Sydney Wallace           Date of Birth: 03-03-1991           MRN: [id number] Visit Date: 07/10/2020              Requested by: [facility] [address] [place],  [place] [address] PCP: [facility]  No chief complaint on file.     HPI: Presents today 2-1/2 months status post open right Achilles debridement with excision of deep retained FiberWire suture.  She is actually doing better.  She is begin physical therapy through her gym.  Her biggest concern is of her scarring and wondering if this will continue to get better.  Assessment & Plan: Visit Diagnoses: No diagnosis found.  Plan: Patient does have some hypertrophic scarring I explained the natural history of this.  She will continue working with physical therapy.  She may follow-up as needed.  Follow-Up Instructions: No follow-ups on file.   Ortho Exam  Patient is alert, oriented, no adenopathy, well-dressed, normal affect, normal respiratory effort. Surgical site incision is overall healed well.  She does have some hypertrophic scarring but no erythema no cellulitis swelling overall is well controlled.  Achilles is palpable and functions.  Compartments are soft and nontender no signs of infection  Imaging: No results found. No images are attached to the encounter.  Labs: No results found for: HGBA1C, ESRSEDRATE, CRP, LABURIC, REPTSTATUS, GRAMSTAIN, CULT, LABORGA   Lab Results  Component Value Date   ALBUMIN 4.1 04/07/2017   ALBUMIN 3.8 04/06/2017   ALBUMIN 3.7 04/05/2017    No results found for: MG No results found for: VD25OH  No results found for: PREALBUMIN CBC EXTENDED Latest Ref Rng & Units 11/04/2019 04/05/2017 04/04/2017  WBC 4.0 - 10.5 K/uL 2.0(L) 3.0(L) 4.2  RBC 3.87 - 5.11 MIL/uL 4.58 4.15 4.57  HGB 12.0 - 15.0 g/dL 18.2 99.3 71.6  HCT 36 - 46 % 39.6 36.2 40.7  PLT 150 - 400 K/uL 229 144(L) 150     There is  no height or weight on file to calculate BMI.  Orders:  No orders of the defined types were placed in this encounter.  No orders of the defined types were placed in this encounter.    Procedures: No procedures performed  Clinical Data: No additional findings.  ROS:  All other systems negative, except as noted in the HPI. Review of Systems  Objective: Vital Signs: There were no vitals taken for this visit.  Specialty Comments:  No specialty comments available.  PMFS History: Patient Active Problem List   Diagnosis Date Noted  . Abscess of right lower leg   . 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 Asthma    as a child - "grew out it"  . PONV (postoperative nausea and vomiting)   . Seasonal allergies   . Umbilical hernia 12/2016    History reviewed. No pertinent family history.  Past Surgical History:  Procedure Laterality Date  . ACHILLES TENDON SURGERY Right 11/08/2019   Procedure: ACHILLES TENDON REPAIR;  Surgeon: Teryl Lucy, MD;  Location: [facility];  Service: Orthopedics;  Laterality: Right;  . ANTERIOR CRUCIATE LIGAMENT REPAIR Left   . I & D EXTREMITY Right 04/18/2020   Procedure: OPEN RIGHT ACHILLES DEBRIDEMENT;  Surgeon: Nadara Mustard, MD;  Location: [facility] OR;  Service: Orthopedics;  Laterality: Right;  . INSERTION OF MESH N/A 01/08/2017   Procedure: INSERTION OF MESH;  Surgeon: Abigail Miyamoto, MD;  Location: [facility];  Service: General;  Laterality: N/A;  . UMBILICAL HERNIA REPAIR N/A 01/08/2017   Procedure: UMBILICAL HERNIA REPAIR;  Surgeon: Abigail Miyamoto, MD;  Location:  SURGERY CENTER;  Service: General;  Laterality: N/A;   Social History   Occupational History  . Not on file  Tobacco Use  . Smoking status: Never Smoker  . Smokeless tobacco: Never Used  Vaping Use  . Vaping Use: Some days  Substance and Sexual Activity  . Alcohol use: Yes     Alcohol/week: 9.0 standard drinks    Types: 3 Glasses of wine, 6 Standard drinks or equivalent per week    Comment: weekends  . Drug use: No  . Sexual activity: Yes    Birth control/protection: None

## 2020-12-14 ENCOUNTER — Ambulatory Visit: Payer: Self-pay | Admitting: Physician Assistant

## 2022-03-24 ENCOUNTER — Emergency Department
Admission: EM | Admit: 2022-03-24 | Discharge: 2022-03-24 | Disposition: A | Discharge status: Home / Self Care | Payer: 59 | Attending: Emergency Medicine | Admitting: Emergency Medicine

## 2022-03-24 ENCOUNTER — Other Ambulatory Visit: Payer: Self-pay

## 2022-03-24 DIAGNOSIS — J45909 Unspecified asthma, uncomplicated: Secondary | ICD-10-CM | POA: Diagnosis not present

## 2022-03-24 DIAGNOSIS — R1013 Epigastric pain: Secondary | ICD-10-CM | POA: Insufficient documentation

## 2022-03-24 DIAGNOSIS — R1010 Upper abdominal pain, unspecified: Secondary | ICD-10-CM | POA: Diagnosis present

## 2022-03-24 LAB — COMPREHENSIVE METABOLIC PANEL
ALT: 13 U/L (ref 0–44)
AST: 19 U/L (ref 15–41)
Albumin: 4.1 g/dL (ref 3.5–5.0)
Alkaline Phosphatase: 62 U/L (ref 38–126)
Anion gap: 6 (ref 5–15)
BUN: 16 mg/dL (ref 6–20)
CO2: 24 mmol/L (ref 22–32)
Calcium: 8.9 mg/dL (ref 8.9–10.3)
Chloride: 109 mmol/L (ref 98–111)
Creatinine, Ser: 0.86 mg/dL (ref 0.44–1.00)
GFR, Estimated: >60 mL/min (ref >60)
Glucose, Bld: 93 mg/dL (ref 70–99)
Potassium: 4.2 mmol/L (ref 3.5–5.1)
Sodium: 139 mmol/L (ref 135–145)
Total Bilirubin: 0.5 mg/dL (ref 0.3–1.2)
Total Protein: 8 g/dL (ref 6.5–8.1)

## 2022-03-24 LAB — CBC
HCT: 37 % (ref 36.0–46.0)
Hemoglobin: 12.1 g/dL (ref 12.0–15.0)
MCH: 28.2 pg (ref 26.0–34.0)
MCHC: 32.7 g/dL (ref 30.0–36.0)
MCV: 86.2 fL (ref 80.0–100.0)
Platelets: 232 10*3/uL (ref 150–400)
RBC: 4.29 MIL/uL (ref 3.87–5.11)
RDW: 12.8 % (ref 11.5–15.5)
WBC: 4.1 10*3/uL (ref 4.0–10.5)
nRBC: 0 % (ref 0.0–0.2)

## 2022-03-24 LAB — POC URINE PREG, ED: Preg Test, Ur: NEGATIVE

## 2022-03-24 LAB — LIPASE, BLOOD: Lipase: 37 U/L (ref 11–51)

## 2022-03-24 MED ORDER — PANTOPRAZOLE SODIUM 40 MG PO TBEC: 40.0000 mg | DELAYED_RELEASE_TABLET | Freq: Every day | ORAL | 0 refills | Status: AC

## 2022-03-24 MED ORDER — ONDANSETRON 4 MG PO TBDP
4.0000 mg | ORAL_TABLET | Freq: Once | ORAL | Status: AC
  Administered 2022-03-24: 4 mg via ORAL
  Filled 2022-03-24: qty 1

## 2022-03-24 MED ORDER — ALUM & MAG HYDROXIDE-SIMETH 200-200-20 MG/5ML PO SUSP
30.0000 mL | Freq: Once | ORAL | Status: AC
  Administered 2022-03-24: 30 mL via ORAL
  Filled 2022-03-24: qty 30

## 2022-03-24 NOTE — ED Provider Notes (Signed)
[facility] Provider Note    Event Date/Time   First MD Initiated Contact with Patient 03/24/22 1635     (approximate)   History   Abdominal Pain   HPI  Sydney Wallace is a 31 y.o. female with past medical history of dysmenorrhea who presents with abdominal pain.  Patient is on the second day of her menstrual period she took an ibuprofen last night around 4 AM subsequently had upper abdominal pain radiating to the chest.  Has been coming and going initially felt better after eating but then woke her up again described as burning and pressure-like sensation.  Her chest pain is nonexertional nonpleuritic comes and goes without clear exacerbating or alleviating factors denies shortness of breath has had similar symptoms with taking NyQuil in the past.  She has history of painful menstrual periods on the first and second day has not seen GYN recently and is not on birth control.  Denies dysuria.  Has had nausea but no vomiting no diarrhea.     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 --      Excl. in GC? --     Most recent vital signs: Vitals:   03/24/22 1243 03/24/22 1656  BP: 135/89 138/80  Pulse: 73 70  Resp: 16 16   Temp: 98.1 F (36.7 C) 98 F (36.7 C)  SpO2: 99% 99%     General: Awake, no distress.  CV:  Good peripheral perfusion.  Resp:  Normal effort.  Abd:  No distention.  Mild tenderness in the epigastric region but no guarding abdomen overall soft benign Neuro:             Awake, Alert, Oriented x 3  Other:     ED Results / Procedures / Treatments  Labs (all labs ordered are listed, but only abnormal results are displayed) Labs Reviewed  LIPASE, BLOOD  COMPREHENSIVE METABOLIC PANEL  CBC  POC URINE PREG, ED     EKG  EKG interpretation performed by myself: NSR, nml axis, nml intervals, no acute ischemic changes    RADIOLOGY    PROCEDURES:  Critical Care performed: No  Procedures   MEDICATIONS ORDERED IN ED: Medications  ondansetron (ZOFRAN-ODT) disintegrating tablet 4 mg (4 mg Oral Given 03/24/22 1702)  alum & mag hydroxide-simeth (MAALOX/MYLANTA) 200-200-20 MG/5ML suspension 30 mL (30 mLs Oral Given 03/24/22 1702)  IMPRESSION / MDM / ASSESSMENT AND PLAN / ED COURSE  I reviewed the triage vital signs and the nursing notes.                              Patient's presentation is most consistent with acute presentation with potential threat to life or bodily function.  Differential diagnosis includes, but is not limited to, gastritis, GERD, pill esophagitis, biliary colic, cholecystitis, gastroenteritis  Patient is a previously healthy 31 year old female presenting with upper abdominal and chest pain after taking ibuprofen.  She took the ibuprofen for painful menstrual period on empty stomach and immediately had abdominal discomfort which has been coming and going seems to improve with eating associate with nausea but no vomiting chest pain seems to be an extension of the abdominal pain which is in the epigastric region comes and goes is not associated with other symptoms other than nausea is nonexertional nonpleuritic patient has no cardiac risk factors or PE risk  factors.  She looks well on exam her abdomen is benign with just mild tenderness in the midepigastric region.  Her EKG is nonischemic.  CBC CMP and lipase are all reassuring.  I suspect that her GI discomfort is in the setting of NSAIDs causing some gastric inflammation.  Will treat with GI cocktail and Zofran.  Check hCG.  Patient thinks she will likely need to continue to take the NSAIDs because of her dysmenorrhea so we will start her on Protonix.  hCG negative    Clinical Course as of 03/24/22 1739  Mon Mar 24, 2022  1739 U pride not crossing over but confirmed by nursing is negative [KM]    Clinical Course User Index [KM] Georga Hacking, MD     FINAL CLINICAL IMPRESSION(S) / ED DIAGNOSES   Final diagnoses:  Epigastric pain     Rx / DC Orders   ED Discharge Orders     None        Note:  This document was prepared using Dragon voice recognition software and may include unintentional dictation errors.   Georga Hacking, MD 03/24/22 1739

## 2022-03-24 NOTE — Discharge Instructions (Addendum)
I suspect that your abdominal pain and chest pain were related to taking ibuprofen on an empty stomach.  If you are going to continue to take ibuprofen I recommend you start taking the Protonix which is an antacid which I have sent to your pharmacy.

## 2022-03-24 NOTE — ED Triage Notes (Signed)
Pt comes with c/o abominal pain, cramping and epigastric pain. Pt states this woke her up at 3am. Pt states she is on her cycle.
